# Patient Record
Sex: Female | Born: 1965 | Race: White | Hispanic: No | Marital: Married | Smoking: Never smoker
Health system: Southern US, Community
[De-identification: ages and names within clinical notes are randomized; demographics above are authoritative.]

## PROBLEM LIST (undated history)

## (undated) DIAGNOSIS — E119 Type 2 diabetes mellitus without complications: Secondary | ICD-10-CM

## (undated) DIAGNOSIS — C801 Malignant (primary) neoplasm, unspecified: Secondary | ICD-10-CM

## (undated) DIAGNOSIS — K769 Liver disease, unspecified: Secondary | ICD-10-CM

## (undated) DIAGNOSIS — E785 Hyperlipidemia, unspecified: Secondary | ICD-10-CM

## (undated) DIAGNOSIS — N814 Uterovaginal prolapse, unspecified: Secondary | ICD-10-CM

## (undated) DIAGNOSIS — M1990 Unspecified osteoarthritis, unspecified site: Secondary | ICD-10-CM

## (undated) DIAGNOSIS — C439 Malignant melanoma of skin, unspecified: Secondary | ICD-10-CM

## (undated) DIAGNOSIS — D126 Benign neoplasm of colon, unspecified: Secondary | ICD-10-CM

## (undated) DIAGNOSIS — N393 Stress incontinence (female) (male): Secondary | ICD-10-CM

## (undated) HISTORY — PX: LIVER BIOPSY: SHX301

## (undated) HISTORY — PX: OTHER SURGICAL HISTORY: SHX169

## (undated) HISTORY — PX: COLONOSCOPY W/ POLYPECTOMY: SHX1380

---

## 2005-05-23 ENCOUNTER — Ambulatory Visit: Payer: Self-pay | Admitting: Obstetrics and Gynecology

## 2006-03-23 ENCOUNTER — Ambulatory Visit: Payer: Self-pay

## 2006-03-29 ENCOUNTER — Ambulatory Visit: Payer: Self-pay | Admitting: Gastroenterology

## 2006-04-28 ENCOUNTER — Ambulatory Visit: Payer: Self-pay | Admitting: Gastroenterology

## 2006-06-06 ENCOUNTER — Ambulatory Visit: Payer: Self-pay | Admitting: Obstetrics and Gynecology

## 2006-08-11 ENCOUNTER — Ambulatory Visit: Payer: Self-pay | Admitting: Gastroenterology

## 2007-06-05 ENCOUNTER — Ambulatory Visit: Payer: Self-pay | Admitting: Internal Medicine

## 2007-06-12 ENCOUNTER — Ambulatory Visit: Payer: Self-pay | Admitting: Obstetrics and Gynecology

## 2008-06-03 ENCOUNTER — Ambulatory Visit: Payer: Self-pay | Admitting: Internal Medicine

## 2008-06-06 ENCOUNTER — Ambulatory Visit: Payer: Self-pay | Admitting: Internal Medicine

## 2008-06-18 ENCOUNTER — Ambulatory Visit: Payer: Self-pay | Admitting: Obstetrics and Gynecology

## 2009-06-22 ENCOUNTER — Ambulatory Visit: Payer: Self-pay | Admitting: Obstetrics and Gynecology

## 2010-06-23 ENCOUNTER — Ambulatory Visit: Payer: Self-pay | Admitting: Obstetrics and Gynecology

## 2011-07-26 ENCOUNTER — Ambulatory Visit: Payer: Self-pay | Admitting: Obstetrics and Gynecology

## 2012-07-26 ENCOUNTER — Ambulatory Visit: Payer: Self-pay | Admitting: Obstetrics and Gynecology

## 2013-01-02 ENCOUNTER — Ambulatory Visit: Payer: Self-pay | Admitting: Internal Medicine

## 2013-01-20 ENCOUNTER — Ambulatory Visit: Payer: Self-pay | Admitting: Internal Medicine

## 2013-02-19 ENCOUNTER — Ambulatory Visit: Payer: Self-pay | Admitting: Internal Medicine

## 2013-07-30 ENCOUNTER — Ambulatory Visit: Payer: Self-pay | Admitting: Obstetrics and Gynecology

## 2014-08-13 ENCOUNTER — Ambulatory Visit: Payer: Self-pay | Admitting: Obstetrics and Gynecology

## 2015-07-20 ENCOUNTER — Other Ambulatory Visit: Payer: Self-pay | Admitting: Obstetrics and Gynecology

## 2015-07-20 DIAGNOSIS — Z1231 Encounter for screening mammogram for malignant neoplasm of breast: Secondary | ICD-10-CM

## 2015-08-18 ENCOUNTER — Ambulatory Visit
Admission: RE | Admit: 2015-08-18 | Discharge: 2015-08-18 | Disposition: A | Discharge status: Home / Self Care | Payer: 59 | Source: Ambulatory Visit | Attending: Obstetrics and Gynecology | Admitting: Obstetrics and Gynecology

## 2015-08-18 DIAGNOSIS — Z1231 Encounter for screening mammogram for malignant neoplasm of breast: Secondary | ICD-10-CM | POA: Diagnosis present

## 2016-07-20 ENCOUNTER — Other Ambulatory Visit: Payer: Self-pay | Admitting: Obstetrics and Gynecology

## 2016-07-20 DIAGNOSIS — Z1231 Encounter for screening mammogram for malignant neoplasm of breast: Secondary | ICD-10-CM

## 2016-09-06 ENCOUNTER — Other Ambulatory Visit: Payer: Self-pay | Admitting: Nurse Practitioner

## 2016-09-06 ENCOUNTER — Other Ambulatory Visit (HOSPITAL_COMMUNITY): Payer: Self-pay | Admitting: Nurse Practitioner

## 2016-09-06 DIAGNOSIS — B009 Herpesviral infection, unspecified: Secondary | ICD-10-CM

## 2016-09-10 ENCOUNTER — Ambulatory Visit
Admission: RE | Admit: 2016-09-10 | Discharge: 2016-09-10 | Disposition: A | Discharge status: Home / Self Care | Payer: 59 | Source: Ambulatory Visit | Attending: Nurse Practitioner | Admitting: Nurse Practitioner

## 2016-09-10 DIAGNOSIS — K760 Fatty (change of) liver, not elsewhere classified: Secondary | ICD-10-CM | POA: Insufficient documentation

## 2016-09-10 DIAGNOSIS — R7989 Other specified abnormal findings of blood chemistry: Secondary | ICD-10-CM | POA: Insufficient documentation

## 2016-09-10 DIAGNOSIS — B009 Herpesviral infection, unspecified: Secondary | ICD-10-CM

## 2016-09-10 DIAGNOSIS — R160 Hepatomegaly, not elsewhere classified: Secondary | ICD-10-CM | POA: Diagnosis not present

## 2016-09-10 LAB — POCT I-STAT CREATININE: Creatinine, Ser: 0.6 mg/dL (ref 0.44–1.00)

## 2016-09-10 MED ORDER — GADOXETATE DISODIUM 0.25 MMOL/ML IV SOLN
10.0000 mL | Freq: Once | INTRAVENOUS | Status: AC | PRN
  Administered 2016-09-10: 7 mL via INTRAVENOUS

## 2017-07-25 ENCOUNTER — Other Ambulatory Visit: Payer: Self-pay | Admitting: Obstetrics and Gynecology

## 2017-07-25 DIAGNOSIS — Z1231 Encounter for screening mammogram for malignant neoplasm of breast: Secondary | ICD-10-CM

## 2017-08-24 ENCOUNTER — Ambulatory Visit
Admission: RE | Admit: 2017-08-24 | Discharge: 2017-08-24 | Disposition: A | Discharge status: Home / Self Care | Payer: Managed Care, Other (non HMO) | Source: Ambulatory Visit | Attending: Obstetrics and Gynecology | Admitting: Obstetrics and Gynecology

## 2017-08-24 DIAGNOSIS — Z1231 Encounter for screening mammogram for malignant neoplasm of breast: Secondary | ICD-10-CM | POA: Insufficient documentation

## 2017-08-24 HISTORY — DX: Malignant (primary) neoplasm, unspecified: C80.1

## 2017-08-25 ENCOUNTER — Inpatient Hospital Stay
Admission: RE | Admit: 2017-08-25 | Discharge: 2017-08-25 | Disposition: A | Discharge status: Home / Self Care | Payer: Self-pay | Source: Ambulatory Visit | Attending: *Deleted | Admitting: *Deleted

## 2017-08-25 ENCOUNTER — Other Ambulatory Visit: Payer: Self-pay | Admitting: *Deleted

## 2017-08-25 DIAGNOSIS — Z9289 Personal history of other medical treatment: Secondary | ICD-10-CM

## 2018-07-26 ENCOUNTER — Other Ambulatory Visit: Payer: Self-pay | Admitting: Obstetrics and Gynecology

## 2018-07-26 DIAGNOSIS — Z1231 Encounter for screening mammogram for malignant neoplasm of breast: Secondary | ICD-10-CM

## 2018-08-27 ENCOUNTER — Ambulatory Visit
Admission: RE | Admit: 2018-08-27 | Discharge: 2018-08-27 | Disposition: A | Discharge status: Home / Self Care | Payer: Managed Care, Other (non HMO) | Source: Ambulatory Visit | Attending: Obstetrics and Gynecology | Admitting: Obstetrics and Gynecology

## 2018-08-27 DIAGNOSIS — Z1231 Encounter for screening mammogram for malignant neoplasm of breast: Secondary | ICD-10-CM | POA: Insufficient documentation

## 2019-07-30 ENCOUNTER — Other Ambulatory Visit: Payer: Self-pay | Admitting: Obstetrics and Gynecology

## 2019-07-30 DIAGNOSIS — Z1231 Encounter for screening mammogram for malignant neoplasm of breast: Secondary | ICD-10-CM

## 2019-09-11 ENCOUNTER — Ambulatory Visit
Admission: RE | Admit: 2019-09-11 | Discharge: 2019-09-11 | Disposition: A | Discharge status: Home / Self Care | Payer: Managed Care, Other (non HMO) | Source: Ambulatory Visit | Attending: Obstetrics and Gynecology | Admitting: Obstetrics and Gynecology

## 2019-09-11 DIAGNOSIS — Z1231 Encounter for screening mammogram for malignant neoplasm of breast: Secondary | ICD-10-CM | POA: Diagnosis not present

## 2020-08-03 ENCOUNTER — Other Ambulatory Visit: Payer: Self-pay | Admitting: Obstetrics and Gynecology

## 2020-08-03 DIAGNOSIS — Z1231 Encounter for screening mammogram for malignant neoplasm of breast: Secondary | ICD-10-CM

## 2020-09-11 ENCOUNTER — Ambulatory Visit
Admission: RE | Admit: 2020-09-11 | Discharge: 2020-09-11 | Disposition: A | Discharge status: Home / Self Care | Payer: Managed Care, Other (non HMO) | Source: Ambulatory Visit | Attending: Obstetrics and Gynecology | Admitting: Obstetrics and Gynecology

## 2020-09-11 ENCOUNTER — Other Ambulatory Visit: Payer: Self-pay

## 2020-09-11 DIAGNOSIS — Z1231 Encounter for screening mammogram for malignant neoplasm of breast: Secondary | ICD-10-CM | POA: Diagnosis not present

## 2021-09-01 ENCOUNTER — Other Ambulatory Visit: Payer: Self-pay | Admitting: Certified Nurse Midwife

## 2021-09-01 DIAGNOSIS — Z1231 Encounter for screening mammogram for malignant neoplasm of breast: Secondary | ICD-10-CM

## 2021-11-05 ENCOUNTER — Ambulatory Visit
Admission: RE | Admit: 2021-11-05 | Discharge: 2021-11-05 | Disposition: A | Discharge status: Home / Self Care | Payer: Managed Care, Other (non HMO) | Source: Ambulatory Visit | Attending: Certified Nurse Midwife | Admitting: Certified Nurse Midwife

## 2021-11-05 ENCOUNTER — Other Ambulatory Visit: Payer: Self-pay

## 2021-11-05 DIAGNOSIS — Z1231 Encounter for screening mammogram for malignant neoplasm of breast: Secondary | ICD-10-CM | POA: Diagnosis present

## 2022-01-13 ENCOUNTER — Ambulatory Visit: Payer: Managed Care, Other (non HMO) | Attending: Obstetrics and Gynecology | Admitting: Physical Therapy

## 2022-01-13 ENCOUNTER — Encounter: Payer: Self-pay | Admitting: Physical Therapy

## 2022-01-13 DIAGNOSIS — R29898 Other symptoms and signs involving the musculoskeletal system: Secondary | ICD-10-CM | POA: Insufficient documentation

## 2022-01-13 DIAGNOSIS — M6281 Muscle weakness (generalized): Secondary | ICD-10-CM | POA: Insufficient documentation

## 2022-01-13 DIAGNOSIS — R278 Other lack of coordination: Secondary | ICD-10-CM | POA: Diagnosis present

## 2022-01-13 NOTE — Therapy (Signed)
OUTPATIENT PHYSICAL THERAPY FEMALE PELVIC EVALUATION   Patient Name: Haley Wolfe MRN: [id number] DOB:[birth year], 56 y.o., female Today's Date: 01/13/2022   PT End of Session - 01/13/22 1424     Visit Number 1    Number of Visits 6    Date for PT Re-Evaluation 02/24/22    Authorization Type IE 01/13/2022    PT Start Time 1430    PT Stop Time 1500    PT Time Calculation (min) 30 min    Activity Tolerance Patient tolerated treatment well;Other (comment)   Evaluation limited by late arrival to clinic.   Behavior During Therapy [facility] for tasks assessed/performed             Past Medical History:  Diagnosis Date   Cancer ([place])    melanoma   History reviewed. No pertinent surgical history. There are no problems to display for this patient.   PCP: [place] Pink, MD  REFERRING PROVIDER: Schermerhorn, Gwen Her, MD   REFERRING DIAG: N39.3 (ICD-10-CM) - Stress incontinence (female)   THERAPY DIAG:  Muscle weakness (generalized)  Other lack of coordination  Poor body mechanics  Rationale for Evaluation and Treatment Rehabilitation  PRECAUTIONS: None  WEIGHT BEARING RESTRICTIONS No  FALLS:  Has patient fallen in last 6 months? No  ONSET DATE: 2005  SUBJECTIVE:                                                                                                                                                                                           CHIEF COMPLAINT: Patient notes some mild improvement in urinary frequency with addition of myrbetriq. Patient notes that she has had less leakage when on a walk. Patient notes UI with physical activity has been ongoing for 15-20 years. Patient does have recollection of difficulty with pushing during childbirth and not being sure how to push. Patient does not have strong belief that she will benefit from PFPT and notes that she does not want participation/success with PFPT to limit her eligibility for surgical repair of cystocele.     PERTINENT HISTORY/CHART REVIEW:  Red flags (bowel/bladder changes, saddle paresthesia, personal history of cancer, h/o spinal tumors, h/o compression fx, h/o abdominal aneurysm, abdominal pain, chills/fever, night sweats, nausea, vomiting, unrelenting pain, first onset of insidious LBP <21 y/o): Negative  "G2P2 SVD .s/p Fourth degree laceration On HRT x 1 yr . Some pelvic pressure at time. Some urgency after emptying bladder. [...] SUI & Grade 1 anterior prolapse"  PAIN:  Are you having pain? No NPRS scale:  0/10 (current)  OCCUPATION/LEISURE ACTIVITIES:  Walking, golf, accountant  PLOF:  Independent  PATIENT GOALS: "Not  having to wear a pantyliner and not worrying about peeing when I cough or laugh."  OBSTETRICAL HISTORY: G2P2 Deliveries: SVD Tearing/Episiotomy: grade 4  GYNECOLOGICAL HISTORY: Hysterectomy: No Vaginal/Abdominal Endometriosis: Negative Last Menstrual Period:  Pain with exam: No  Prolapse: Cystocele grade 1 Heaviness/pressure: No   UROLOGICAL HISTORY: Frequency of urination: unable to articulate/speculate, notes frustration with frequency Incontinence: Coughing, Sneezing, Laughing, and Exercise  Onset: 2005 Amount: Min ~1%, Mod 98%, Complete Loss <1%.  Protective undergarments: Yes   Type: pantyliner; Number used/day: 5x Fluid Intake: ~16 oz H20, 1-2 cups coffee caffeinated, occasional diet coke Nocturia: 1x/night Toileting posture: feet flat Incomplete emptying: No sensation for it; however when she double voids she notes substantial volume with second void Pain with urination: Negative Stream: Strong Urgency: Yes  Difficulty initiating urination: Negative Intermittent stream: Negative Frequent UTI: Negative.   GASTROINTESTINAL HISTORY: Type of bowel movement: (Bristol Stool Scale) 3-4 Frequency of BMs: 1x/day Incomplete bowel movement: No  Pain with defecation: Negative Straining with defecation: Negative Toileting posture: feet  flat Incontinence: Negative.   SEXUAL HISTORY AND FUNCTION: Sexually active: Yes  Pain with penetration: deep thrusting and interrupts sexual activity "occasional" has not happened in past year but was an issue after birth of first child (~ 28 years ago) Pain with external stimulation: No  Change in ability to achieve orgasm: No   OBJECTIVE:  DIAGNOSTIC TESTING/IMAGING: "Neck: no thyromegaly Abdomen: soft , no mass, normal active bowel sounds, non-tender, no rebound tenderness Pelvic: tanner stage 5 ,  External genitalia: vulva /labia no lesions Urethra: no prolapse  Office cystometrics performed  Emptied bladder : no Leakage with cough  q tip 45 degree straight cath - residual 50 cc Filling with sterile H20 : first urge 130cc, max urge 330 cc Minimal leakage with lithotomy  + leakage with straddling step - standing"  COGNITION:  Patient is oriented to person, place, and time.  Recent memory is intact.  Remote memory is intact.  Attention span and concentration are intact.  Expressive speech is intact.  Patient's fund of knowledge is within normal limits for educational level.    POSTURE/OBSERVATIONS:   Lumbar lordosis: diminished Thoracic kyphosis: increased Patient sits with posteriorly tilted pelvis and shortened anterior chain which has high likelihood of increasing downward force/pressure on anterior portion of pelvis including bladder.  Iliac crest height: not formally assessed  Lumbar lateral shift: not formally assessed  Pelvic obliquity: not formally assessed  Leg length discrepancy: not formally assessed    GAIT:  Distance walked: 70 ft Assistive device utilized: None Level of assistance: Complete Independence Trendelenburg R: Negative L: Negative Grossly WFL.   RANGE OF MOTION: deferred 2/2 to time constraints  AROM (Normal range in degrees) AROM  01/13/2022  Lumbar   Flexion (65)   Extension (30)   Right lateral flexion (25)   Left lateral  flexion (25)   Right rotation (30)   Left rotation (30)       Hip LEFT RIGHT  Flexion (125)    Extension (15)    Abduction (40)    Adduction     Internal Rotation (45)    External Rotation (45)    (* = pain; blank rows = not tested)   SENSATION: deferred 2/2 to time constraints  Grossly intact to light touch bilateral LEs as determined by testing dermatomes L2-S2 Proprioception and hot/cold testing deferred on this date   STRENGTH: MMT deferred 2/2 to time constraints   RLE LLE  Hip Flexion  Hip Extension    Hip Abduction     Hip Adduction     Hip ER     Hip IR     Knee Extension    Knee Flexion    Dorsiflexion     Plantarflexion (seated)    (* = pain; blank rows = not tested)   MUSCLE LENGTH: deferred 2/2 to time constraints  Hamstrings:  Ely (quadriceps):  Thomas (hip flexors):  Ober:    ABDOMINAL: deferred 2/2 to time constraints  Palpation: Diastasis: Scar mobility: Rib flare:   SPECIAL TESTS: deferred 2/2 to time constraints   PALPATION: deferred 2/2 to time constraints  LOCATION LEFT  RIGHT           Lumbar paraspinals    Quadratus Lumborum    Gluteus Maximus    Gluteus Medius    Deep hip external rotators    PSIS    Fortin's Area (SIJ)    Greater Trochanter    ASIS    Sacral border    Coccyx    Ischial tuberosity    (blank rows = not tested) Graded on 0-4 scale (0 = no pain, 1 = pain, 2 = pain with wincing/grimacing/flinching, 3 = pain with withdrawal, 4 = unwilling to allow palpation)   PHYSICAL PERFORMANCE MEASURES:  STS: WNL Deep Squat: RLE STS: LLE STS:  6MWT: 5TSTS:     EXTERNAL PELVIC EXAM: deferred 2/2 to time constraints None given; testing deferred to later date  Breath coordination: Voluntary Contraction: present/absent Relaxation: full/delayed/non-relaxing Perineal movement with sustained IAP increase ("bear down"): descent/no change/elevation/excessive descent Perineal movement with rapid IAP increase  ("cough"): elevation/no change/descent Palpation of bulbocavernosus: Palpation of ischiocavernosus: Palpation of pubic symphysis: Palpation of superficial transverse perineal:   INTERNAL VAGINAL EXAM: deferred 2/2 to time constraints None given; testing deferred to later date  Introitus Appears:  Skin integrity:  Scar mobility: Strength (PERF):  Symmetry: Palpation: Prolapse: (0 no contraction, 1 flicker, 2 weak squeeze and no lift, 3 fair squeeze and definite lift, 4 good squeeze and lift against resistance, 5 strong squeeze against strong resistance)   RECTAL EXAM: not indicated None given; testing deferred to later date  Anal wink: present/absent Symmetry: Palpation: Strength (PERF):  (0 no contraction, 1 flicker, 2 weak squeeze and no lift, 3 fair squeeze and definite lift, 4 good squeeze and lift against resistance, 5 strong squeeze against strong resistance)   PATIENT EDUCATION:  Patient educated on prognosis, POC, and provided with HEP including: bladder diary. Patient articulated understanding and returned demonstration. Patient will benefit from further education in order to maximize compliance and understanding for long-term therapeutic gains.   PATIENT SURVEYS:  FOTO Urinary Problem 49; PFDI Urinary 38  ASSESSMENT:  Clinical Impression: Patient is a 56 year old presenting to clinic with chief complaints of urinary frequency, urgency, and incontinence. Today's evaluation is suggestive of deficits in IAP management, PFM coordination, PFM strength, and posture as evidenced by increased thoracic kyphosis and posteriorly tilted pelvis impeding symmetrical force distribution through the pelvis, UI with coughing/laughing/sneezing/exercise, incomplete bladder emptying. Patient's responses on FOTO outcome measures (Urinary Problem 49, PFDI Urinary 38) indicate significant functional limitations/disability/distress. Patient's progress may be limited due to decreased belief in  efficacy and limited time before surgical intervention; however, patient's attendance today is advantageous.Patient will benefit from continued skilled therapeutic intervention to address deficits in IAP management, PFM coordination, PFM strength, and posture in order to increase function and improve overall QOL.   Objective impairments: decreased activity tolerance, decreased  coordination, decreased endurance, decreased knowledge of condition, decreased strength, improper body mechanics, and postural dysfunction.   Activity limitations: cleaning, laundry, interpersonal relationship, community activity, occupation, and yard work.   Personal factors: Behavior pattern, Past/current experiences, Time since onset of injury/illness/exacerbation, 1 comorbidity: DM, and decreased belief in efficacy  are also affecting patient's functional outcome.   Rehab Potential: Fair Patient not reporting high confidence in efficacy of PFPT nad her ability/willingness to participate consistently with HEP. Patient does not want to choose between PFPT and surgical repair for complaint.  Clinical decision making: Evolving/moderate complexity  Evaluation complexity: Moderate   GOALS: Goals reviewed with patient? Yes  SHORT TERM GOALS: Target date: 02/10/2022  Patient will demonstrate independence with HEP in order to maximize therapeutic gains and improve carryover from physical therapy sessions to ADLs in the home and community. Baseline: not initiated Goal status: INITIAL   LONG TERM GOALS: Target date: 02/24/2022  Patient will demonstrate improved function as evidenced by a score of 60 on FOTO measure for full participation in activities at home and in the community.  Baseline: 49 Goal status: INITIAL  Patient will demonstrate circumferential and sequential contraction of >3/5 MMT, > 5 sec hold x5 and 5 consecutive quick flicks with </= 10 min rest between testing bouts, and relaxation of the PFM coordinated  with breath for improved management of intra-abdominal pressure and normal bowel and bladder function without the presence of pain nor incontinence in order to improve participation at home and in the community. Baseline: not formally assessed  Goal status: INITIAL  Patient will be able to articulate and demonstrate 3-5 postures that encourage gravity assisted repositioning of pelvic organs to decrease discomfort and need for manual insertion of tissue at end of day in order to participate more fully in activities at home and in the community.  Baseline: not formally assessed  Goal status: INITIAL  Patient will be able to walk for 30 minutes at [facility] pace with no UI and with no reliance on protective undergarment in order to effect meaningful change for the patient and improve function. Baseline: 5x pantyliners/day Goal status: INITIAL   PLAN: Rehab frequency: 1x/week  Rehab duration: 6 weeks  Planned interventions: Therapeutic exercises, Therapeutic activity, Neuromuscular re-education, Balance training, Gait training, Patient/Family education, Joint mobilization, Orthotic/Fit training, Electrical stimulation, Spinal mobilization, Cryotherapy, Moist heat, Taping, and Manual therapy    Myles Gip PT, DPT [phone] 01/13/2022, 3:25 PM

## 2022-01-20 ENCOUNTER — Ambulatory Visit: Payer: Managed Care, Other (non HMO) | Attending: Obstetrics and Gynecology | Admitting: Physical Therapy

## 2022-01-20 ENCOUNTER — Encounter: Payer: Self-pay | Admitting: Physical Therapy

## 2022-01-20 DIAGNOSIS — M6281 Muscle weakness (generalized): Secondary | ICD-10-CM | POA: Diagnosis present

## 2022-01-20 DIAGNOSIS — R29898 Other symptoms and signs involving the musculoskeletal system: Secondary | ICD-10-CM | POA: Diagnosis present

## 2022-01-20 DIAGNOSIS — R278 Other lack of coordination: Secondary | ICD-10-CM | POA: Diagnosis present

## 2022-01-20 NOTE — Therapy (Signed)
OUTPATIENT PHYSICAL THERAPY FEMALE PELVIC EVALUATION   Patient Name: Haley Wolfe MRN: [id number] DOB:1965-10-22, 56 y.o., female Today's Date: 01/20/2022   PT End of Session - 01/20/22 1123     Visit Number 2    Number of Visits 6    Date for PT Re-Evaluation 02/24/22    Authorization Type IE 01/13/2022    PT Start Time 1120    PT Stop Time 1200    PT Time Calculation (min) 40 min    Activity Tolerance Patient tolerated treatment well   Evaluation limited by late arrival to clinic.   Behavior During Therapy [facility] for tasks assessed/performed             Past Medical History:  Diagnosis Date   Cancer ([place])    melanoma   History reviewed. No pertinent surgical history. There are no problems to display for this patient.   PCP: [place] Pink, MD  REFERRING PROVIDER: Schermerhorn, Gwen Her, MD   REFERRING DIAG: N39.3 (ICD-10-CM) - Stress incontinence (female)   THERAPY DIAG:  Other lack of coordination  Poor body mechanics  Muscle weakness (generalized)  Rationale for Evaluation and Treatment Rehabilitation  PRECAUTIONS: None  WEIGHT BEARING RESTRICTIONS No  FALLS:  Has patient fallen in last 6 months? No  ONSET DATE: 2005  SUBJECTIVE:                                                                                                                                                                                           Patient notes that she kept bladder diary but fund it difficult to keep consistently. Bladder diary demonstrates emptying with small volume voided. Patient notes ability to store is usually somewhere between 1-1.5 hours.    PERTINENT HISTORY/CHART REVIEW:  Red flags (bowel/bladder changes, saddle paresthesia, personal history of cancer, h/o spinal tumors, h/o compression fx, h/o abdominal aneurysm, abdominal pain, chills/fever, night sweats, nausea, vomiting, unrelenting pain, first onset of insidious LBP <21 y/o): Negative  "G2P2 SVD .s/p  Fourth degree laceration On HRT x 1 yr . Some pelvic pressure at time. Some urgency after emptying bladder. [...] SUI & Grade 1 anterior prolapse"  PAIN:  Are you having pain? No NPRS scale:  0/10 (current)  OCCUPATION/LEISURE ACTIVITIES:  Walking, golf, accountant  PLOF:  Independent  PATIENT GOALS: "Not having to wear a pantyliner and not worrying about peeing when I cough or laugh."  TREATMENT  Pre-treatment assessment: DIAGNOSTIC TESTING/IMAGING: "Neck: no thyromegaly Abdomen: soft , no mass, normal active bowel sounds, non-tender, no rebound tenderness Pelvic: tanner stage 5 ,  External genitalia: vulva /labia no lesions Urethra: no prolapse  Office cystometrics performed  Emptied bladder : no Leakage with cough  q tip 45 degree straight cath - residual 50 cc Filling with sterile H20 : first urge 130cc, max urge 330 cc Minimal leakage with lithotomy  + leakage with straddling step - standing"   RANGE OF MOTION:   AROM (Normal range in degrees) AROM  01/20/2022  Lumbar   Flexion (65) WNL  Extension (30) WNL  Right lateral flexion (25) WNL  Left lateral flexion (25) WNL  Right rotation (30) WNL  Left rotation (30) WNL      Hip LEFT RIGHT  Flexion (125) WNL WNL  Extension (15)    Abduction (40) WNL WNL  Adduction     Internal Rotation (45) 15 degrees 15 degrees  External Rotation (45) WNL WNL  (* = pain; blank rows = not tested)   STRENGTH: MMT   RLE LLE  Hip Flexion 5 5  Hip Extension    Hip Abduction (seated) 5 5  Hip Adduction (seated) 5 5  Hip ER  4 4  Hip IR  5 4  Knee Extension 4 4  Knee Flexion 4 4  Dorsiflexion     Plantarflexion (seated)    (* = pain; blank rows = not tested)   MUSCLE LENGTH:   Hamstrings: ~ 60 degrees B Ely (quadriceps):  Thomas (hip flexors):  Ober:   ABDOMINAL:  Palpation: TTP over suprapubic area, with wince and withdraw from palpation Diastasis: ~1 finger or less breadth throughout Rib flare: none  noted  EXTERNAL PELVIC EXAM:  Patient educated on the purpose of the procedure/exam and articulated understanding and consented to the procedure/exam verbally.   Breath coordination: present, but not pronounced Voluntary Contraction: absent in isolation, significant abdominal, adductor and gluteal compensatory mechanisms. Unable to isolate with multiple trials, but did achieve 1/5 with increased cueing Relaxation: full Perineal movement with sustained IAP increase ("bear down"): no change Perineal movement with rapid IAP increase ("cough"): descent  01/20/2022  Manual Therapy:   Neuromuscular Re-education: Patient educated extensively on typical bladder function, typical bladder habits, basics of urge suppression, bladder irritants in order to better regulate bladder through behavioral changes.  Patient education on strategies and stretches to create length in PFM including: Supine knee to chest with PFM lengthening, BLE, for improved PFM spasm release Supine double knee to chest with PFM lengthening for improved PFM spasm release Supine butterfly with PFM lengthening, BLE, for improved PFM tissue length Supine figure 4 with PFM lengthening, BLE, for improved PFM tissue length  Therapeutic Exercise:   Treatments unbilled:  Post-treatment assessment:  Patient educated throughout session on appropriate technique and form using multi-modal cueing, HEP, and activity modification. Patient articulated understanding and returned demonstration.  Patient Response to interventions:    PATIENT SURVEYS:  FOTO Urinary Problem 49; PFDI Urinary 38  ASSESSMENT:  Clinical Impression: Patient presents to clinic with excellent motivation to participate in therapy. Patient demonstrates deficits in IAP management, PFM coordination, PFM strength, and posture. Patient able to achieve basic understanding of urinary urge and typical bladder function during today's session and responded positively to  educational interventions. External PFM assessment did confirm deficits with both coordination and strength as evidenced by perineal descent with sudden increase in IAP, lack of perineal movement with sustained IAP increase, and inability to isolate PFM for contraction with both squeeze and lift components. Also of note, patient id have increased tenderness to palpation of suprapubic area with wincing and withdraw from palpation.Patient will benefit from continued  skilled therapeutic intervention to address remaining deficits in IAP management, PFM coordination, PFM strength, and posture in order to increase function and improve overall QOL.    Objective impairments: decreased activity tolerance, decreased coordination, decreased endurance, decreased knowledge of condition, decreased strength, improper body mechanics, and postural dysfunction.   Activity limitations: cleaning, laundry, interpersonal relationship, community activity, occupation, and yard work.   Personal factors: Behavior pattern, Past/current experiences, Time since onset of injury/illness/exacerbation, 1 comorbidity: DM, and decreased belief in efficacy  are also affecting patient's functional outcome.   Rehab Potential: Fair Patient not reporting high confidence in efficacy of PFPT nad her ability/willingness to participate consistently with HEP. Patient does not want to choose between PFPT and surgical repair for complaint.  Clinical decision making: Evolving/moderate complexity  Evaluation complexity: Moderate   GOALS: Goals reviewed with patient? Yes  SHORT TERM GOALS: Target date: 02/10/2022  Patient will demonstrate independence with HEP in order to maximize therapeutic gains and improve carryover from physical therapy sessions to ADLs in the home and community. Baseline: not initiated Goal status: INITIAL   LONG TERM GOALS: Target date: 02/24/2022  Patient will demonstrate improved function as evidenced by a score of  60 on FOTO measure for full participation in activities at home and in the community.  Baseline: 49 Goal status: INITIAL  Patient will demonstrate circumferential and sequential contraction of >3/5 MMT, > 5 sec hold x5 and 5 consecutive quick flicks with </= 10 min rest between testing bouts, and relaxation of the PFM coordinated with breath for improved management of intra-abdominal pressure and normal bowel and bladder function without the presence of pain nor incontinence in order to improve participation at home and in the community. Baseline: not formally assessed  Goal status: INITIAL  Patient will be able to articulate and demonstrate 3-5 postures that encourage gravity assisted repositioning of pelvic organs to decrease discomfort and need for manual insertion of tissue at end of day in order to participate more fully in activities at home and in the community.  Baseline: not formally assessed  Goal status: INITIAL  Patient will be able to walk for 30 minutes at [facility] pace with no UI and with no reliance on protective undergarment in order to effect meaningful change for the patient and improve function. Baseline: 5x pantyliners/day Goal status: INITIAL   PLAN: Rehab frequency: 1x/week  Rehab duration: 6 weeks  Planned interventions: Therapeutic exercises, Therapeutic activity, Neuromuscular re-education, Balance training, Gait training, Patient/Family education, Joint mobilization, Orthotic/Fit training, Electrical stimulation, Spinal mobilization, Cryotherapy, Moist heat, Taping, and Manual therapy    Myles Gip PT, DPT [phone] 01/20/2022, 11:23 AM

## 2022-01-27 ENCOUNTER — Encounter: Payer: Managed Care, Other (non HMO) | Admitting: Physical Therapy

## 2022-02-03 ENCOUNTER — Ambulatory Visit: Payer: Managed Care, Other (non HMO) | Admitting: Physical Therapy

## 2022-02-08 ENCOUNTER — Encounter: Payer: Managed Care, Other (non HMO) | Admitting: Physical Therapy

## 2022-02-17 ENCOUNTER — Ambulatory Visit: Payer: Managed Care, Other (non HMO) | Admitting: Physical Therapy

## 2022-04-06 ENCOUNTER — Other Ambulatory Visit: Payer: Managed Care, Other (non HMO)

## 2022-04-27 NOTE — H&P (Signed)
Haley Wolfe is a 56 y.o. female presenting with Pre-op Exam (PMB)   History of Present Illness: Pt was having PMB and returned for SIS and EMBx 11/05/21. At this time, I was unable to fully evaluate her endometrial lining due to scarring with prior ablation. She also has POP and SUI. She returns today for preop for planned LAVH with BSO and bladder sling with Dr. Ouida Sills as co-surgeon. She has seen PFPT twice since her last visit with me.    EMBx 08/2021 RARE BENIGN ENDOMETRIAL FRAGMENTS ARE SEEN IN A SPECIMEN THAT CONSISTS MAINLY OF MUCUS, BLOOD, AND ENDOCERVICAL GLANDS. NO ATYPIA OR MALIGNANCY.   SALINE [place] 10/2021 - Uterus: 10.78 x 8.35 x 8.57 cm - Ovaries: neither seen  - Endometrium: 7.78 mm  - Other: 3 fibroids 1 rt ant=2.69 cm, 2 lt ant=2.05 cm, 3 rt mid= 2.64 cm  Last pap 07/2019 neg/neg    Pertinent Hx:  - SVD x 2, with 4th degree vaginal tearing  - Hx of endometrial ablation 2010 with return of AUB.    -Hot flashes, no sleeping, and irritability concerns in 07/2018             - HRT: Vivelle dot 0.075 mg and Prometrium 100 mg -Grade I anterior pelvic organ prolapse -SUI, very bothersome & limits her exercise             -Discussed several options for POP & SUI,  including PFPT and surgical, discussed referral to urogyn for urodynamics -Pt was interested in pessary and was considering returning for one   - Hx of melanoma; excised in 2000   -Hx of fatty liver; bx benign             -No active liver disease but does have a hx of elevated LFTs and liver lesions -S/p OCPs for menstrual control, but stopped 2019 due to of elevated liver enzymes & hyperlipidemia.               -We decreased the estrogen dose 2018, but eventually went off a few months prior to 2019  annual because labs did not improve.   -Vasectomy -Left labial bump 2019, HSV collected but negative  Past Medical History:  has a past medical history of Colon cancer screening (09/09/2016), Diabetes mellitus  without complication (CMS-HCC), Elevated LFTs (09/06/2016), Hepatic lesion (09/06/2016), Hyperlipidemia, and Hypertension.  Past Surgical History:  has a past surgical history that includes Episiotomy/Vaginal Repair; Excision melanoma (2000); Endometrial ablation (01/23/2009); Colonoscopy (06/19/2017); and egd (02/02/2018). Family History: family history includes Alzheimer's disease in her maternal grandmother and paternal grandmother; Breast cancer in her maternal grandmother; Colon cancer in her maternal grandfather; High blood pressure (Hypertension) in her father; Hyperlipidemia (Elevated cholesterol) in her father; Myocardial Infarction (Heart attack) in her father. Social History:  reports that she has never smoked. She has never used smokeless tobacco. She reports current alcohol use of about 1.0 standard drink per week. She reports that she does not use drugs. OB/GYN History:  OB History    Gravida 2  Para 2  Term 2  Preterm    AB    Living 2    SAB    IAB    Ectopic    Molar    Multiple    Live Births 2      Allergies: is allergic to penicillin v potassium. Medications: Current Outpatient Medications:    ALPRAZolam (XANAX) 0.25 MG tablet, TAKE 1 TABLET BY MOUTH AT  BEDTIME AS NEEDED,  Disp: 90 tablet, Rfl: 1   BIOTIN ORAL, Take by mouth, Disp: , Rfl:    blood glucose diagnostic (ONETOUCH VERIO) test strip, Use 2 (two) times daily. DX CODE E11.9, Disp: 180 each, Rfl: 3   dulaglutide (TRULICITY) 3 GE/9.5 mL subcutaneous pen injector, Inject 0.5 mLs (3 mg total) subcutaneously every 7 (seven) days, Disp: 6 mL, Rfl: 1   estradiol (VIVELLE-DOT) patch 0.075 mg/24hr, Place 1 patch onto the skin twice a week, Disp: 24 patch, Rfl: 3   fluticasone propionate (FLONASE) 50 mcg/actuation nasal spray, Place 1 spray into both nostrils 2 (two) times daily, Disp: 48 g, Rfl: 0   meloxicam (MOBIC) 15 MG tablet, TAKE 1 TABLET(15 MG) BY MOUTH EVERY DAY, Disp: 90 tablet,  Rfl: 3   metFORMIN (GLUCOPHAGE-XR) 500 MG XR tablet, Take 2 tablets (1,000 mg total) by mouth daily with dinner, Disp: 180 tablet, Rfl: 3   mirabegron (MYRBETRIQ) 25 mg ER Tablet, Take 1 tablet (25 mg total) by mouth once daily, Disp: 30 tablet, Rfl: 11   olopatadine (PATADAY) 0.2 % ophthalmic solution, Place 1 drop into both eyes once daily, Disp: 2.5 mL, Rfl: 0   omeprazole (PRILOSEC) 40 MG DR capsule, Take 1 capsule (40 mg total) by mouth once daily, Disp: 90 capsule, Rfl: 3   progesterone (PROMETRIUM) 100 MG capsule, TAKE 1 CAPSULE BY MOUTH  ONCE DAILY, Disp: 90 capsule, Rfl: 3   rosuvastatin (CRESTOR) 20 MG tablet, Take 1 tablet (20 mg total) by mouth once daily, Disp: 90 tablet, Rfl: 3   valACYclovir (VALTREX) 500 MG tablet, Take 1 tablet (500 mg total) by mouth once daily, Disp: 90 tablet, Rfl: 3   zolpidem (AMBIEN) 5 MG tablet, TAKE 1 TABLET BY MOUTH AT  BEDTIME AS NEEDED FOR SLEEP, Disp: 90 tablet, Rfl: 1  Review of Systems: No SOB, no palpitations or chest pain, no new lower extremity edema, no nausea or vomiting or bowel or bladder complaints. See HPI for gyn specific ROS.   Exam:  LMP 05/30/2017 (Approximate)   General: Patient is well-groomed, well-nourished, appears stated age in no acute distress   HEENT: head is atraumatic and normocephalic, trachea is midline, neck is supple with no palpable nodules   CV: Regular rhythm and normal heart rate, no murmur   Pulm: Clear to auscultation throughout lung fields with no wheezing, crackles, or rhonchi. No increased work of breathing  Abdomen: soft , no mass, non-tender, no rebound tenderness, no hepatomegaly  Pelvic: tanner stage 5 ,   External genitalia: vulva /labia no lesions  Urethra: no prolapse  Vagina: normal physiologic d/c, laxity in vaginal walls  Cervix: no lesions, no cervical motion tenderness, good descent  Uterus: normal size shape and contour, non-tender  Adnexa: no mass,  non-tender    Rectovaginal: External  wnl  Impression:  The primary encounter diagnosis was Post-menopausal bleeding. A diagnosis of Stress incontinence in female was also pertinent to this visit.  Plan:  - PMB, POP, Preop Exam  -Patient returns for a preoperative discussion regarding her plans to proceed with surgical treatment of her PMB and pelvic organ prolapse by total laparoscopic assisted vaginal hysterectomy with bilateral salpingectomy and oophorectomy. Dr. Ouida Sills will perform a mid-urethral sling for her stress urinary incontinence. We may perform a cystoscopy to evaluate the urinary tract after the procedure, if surgically indicated for uro tract integrity.   The patient and I discussed the technical aspects of the procedure including the potential for risks and complications.  These include but  are not limited to the risk of infection requiring post-operative antibiotics or further procedures.  We talked about the risk of injury to adjacent organs including bladder, bowel, ureter, blood vessels or nerves.  We talked about the need to convert to an open incision.  We talked about the possible need for blood transfusion.  We talked about postop complications such as thromboembolic or cardiopulmonary complications.  All of her questions were answered.  Her preoperative exam was completed and the appropriate consents were signed. She is scheduled to undergo this procedure in the near future.  Specific Peri-operative Considerations:  - Consent: obtained today - Health Maintenance: up to date - Labs: CBC, CMP preoperatively - Studies: EKG, CXR preoperatively per anesthesia - Bowel Preparation: None required - Abx:  Ancef 2 g - VTE ppx: SCDs perioperatively - Glucose Protocol: n/a - Beta-blockade: if required

## 2022-05-03 ENCOUNTER — Other Ambulatory Visit: Payer: Self-pay

## 2022-05-03 ENCOUNTER — Encounter
Admission: RE | Admit: 2022-05-03 | Discharge: 2022-05-03 | Disposition: A | Discharge status: Home / Self Care | Payer: Managed Care, Other (non HMO) | Source: Ambulatory Visit | Attending: Obstetrics and Gynecology | Admitting: Obstetrics and Gynecology

## 2022-05-03 DIAGNOSIS — Z01812 Encounter for preprocedural laboratory examination: Secondary | ICD-10-CM

## 2022-05-03 HISTORY — DX: Hyperlipidemia, unspecified: E78.5

## 2022-05-03 HISTORY — DX: Liver disease, unspecified: K76.9

## 2022-05-03 HISTORY — DX: Malignant melanoma of skin, unspecified: C43.9

## 2022-05-03 HISTORY — DX: Uterovaginal prolapse, unspecified: N81.4

## 2022-05-03 HISTORY — DX: Stress incontinence (female) (male): N39.3

## 2022-05-03 HISTORY — DX: Benign neoplasm of colon, unspecified: D12.6

## 2022-05-03 HISTORY — DX: Unspecified osteoarthritis, unspecified site: M19.90

## 2022-05-03 HISTORY — DX: Type 2 diabetes mellitus without complications: E11.9

## 2022-05-03 NOTE — Patient Instructions (Addendum)
Your procedure is scheduled on: 05/12/22 - Thursday Report to the Registration Desk on the 1st floor of the [facility]. To find out your arrival time, please call [phone] between 1PM - 3PM on: 05/11/22 - Wednesday If your arrival time is 6:00 am, do not arrive prior to that time as the [facility] entrance doors do not open until 6:00 am.  REMEMBER: Instructions that are not followed completely may result in serious medical risk, up to and including death; or upon the discretion of your surgeon and anesthesiologist your surgery may need to be rescheduled.  Do not eat food after midnight the night before surgery.  No gum chewing, lozengers or hard candies.  You may however, drink CLEAR liquids up to 2 hours before you are scheduled to arrive for your surgery. Do not drink anything within 2 hours of your scheduled arrival time.Type 1 and Type 2 diabetics should only drink water.  TAKE THESE MEDICATIONS THE MORNING OF SURGERY WITH A SIP OF WATER:  - omeprazole (PRILOSEC) - (take one the night before and one on the morning of surgery - helps to prevent nausea after surgery.)  HOLD Dulaglutide 7 days prior to your surgery.  HOLD metFORMIN (GLUCOPHAGE-XR) beginning 05/10/22, may resume the day after your surgery.  One week prior to surgery: meloxicam [facility])  Stop Anti-inflammatories (NSAIDS) such as Advil, Aleve, Ibuprofen, Motrin, Naproxen, Naprosyn and Aspirin based products such as Excedrin, Goodys Powder, BC Powder.  Stop ANY OVER THE COUNTER supplements until after surgery.  You may take Tylenol if needed for pain up until the day of surgery.  No Alcohol for 24 hours before or after surgery.  No Smoking including e-cigarettes for 24 hours prior to surgery.  No chewable tobacco products for at least 6 hours prior to surgery.  No nicotine patches on the day of surgery.  Do not use any "recreational" drugs for at least a week prior to your surgery.  Please be advised that  the combination of cocaine and anesthesia may have negative outcomes, up to and including death. If you test positive for cocaine, your surgery will be cancelled.  On the morning of surgery brush your teeth with toothpaste and water, you may rinse your mouth with mouthwash if you wish. Do not swallow any toothpaste or mouthwash.  Use CHG Soap or wipes as directed on instruction sheet.  Do not wear jewelry, make-up, hairpins, clips or nail polish.  Do not wear lotions, powders, or perfumes.   Do not shave body from the neck down 48 hours prior to surgery just in case you cut yourself which could leave a site for infection.  Also, freshly shaved skin may become irritated if using the CHG soap.  Contact lenses, hearing aids and dentures may not be worn into surgery.  Do not bring valuables to the hospital. [facility] is not responsible for any missing/lost belongings or valuables.   Notify your doctor if there is any change in your medical condition (cold, fever, infection).  Wear comfortable clothing (specific to your surgery type) to the hospital.  After surgery, you can help prevent lung complications by doing breathing exercises.  Take deep breaths and cough every 1-2 hours. Your doctor may order a device called an Incentive Spirometer to help you take deep breaths. When coughing or sneezing, hold a pillow firmly against your incision with both hands. This is called "splinting." Doing this helps protect your incision. It also decreases belly discomfort.  If you are being  admitted to the hospital overnight, leave your suitcase in the car. After surgery it may be brought to your room.  If you are being discharged the day of surgery, you will not be allowed to drive home. You will need a responsible adult (18 years or older) to drive you home and stay with you that night.   If you are taking public transportation, you will need to have a responsible adult (18 years or older) with  you. Please confirm with your physician that it is acceptable to use public transportation.   Please call the [facility] Dept. at [phone] if you have any questions about these instructions.  Surgery Visitation Policy:  Patients undergoing a surgery or procedure may have two family members or support persons with them as long as the person is not COVID-19 positive or experiencing its symptoms.   Inpatient Visitation:    Visiting hours are 7 a.m. to 8 p.m. Up to four visitors are allowed at one time in a patient room, including children. The visitors may rotate out with other people during the day. One designated support person (adult) may remain overnight.

## 2022-05-10 ENCOUNTER — Encounter
Admission: RE | Admit: 2022-05-10 | Discharge: 2022-05-10 | Disposition: A | Discharge status: Home / Self Care | Payer: Managed Care, Other (non HMO) | Source: Ambulatory Visit | Attending: Obstetrics and Gynecology | Admitting: Obstetrics and Gynecology

## 2022-05-10 DIAGNOSIS — Z01812 Encounter for preprocedural laboratory examination: Secondary | ICD-10-CM | POA: Insufficient documentation

## 2022-05-10 DIAGNOSIS — N950 Postmenopausal bleeding: Secondary | ICD-10-CM | POA: Diagnosis not present

## 2022-05-10 LAB — BASIC METABOLIC PANEL
Anion gap: 10 (ref 5–15)
BUN: 10 mg/dL (ref 6–20)
CO2: 24 mmol/L (ref 22–32)
Calcium: 9.3 mg/dL (ref 8.9–10.3)
Chloride: 106 mmol/L (ref 98–111)
Creatinine, Ser: 0.57 mg/dL (ref 0.44–1.00)
GFR, Estimated: >60 mL/min (ref >60)
Glucose, Bld: 100 mg/dL — ABNORMAL HIGH (ref 70–99)
Potassium: 4.5 mmol/L (ref 3.5–5.1)
Sodium: 140 mmol/L (ref 135–145)

## 2022-05-10 LAB — TYPE AND SCREEN
ABO/RH(D): A POS
Antibody Screen: NEGATIVE

## 2022-05-10 LAB — CBC
HCT: 44.7 % (ref 36.0–46.0)
Hemoglobin: 15.2 g/dL — ABNORMAL HIGH (ref 12.0–15.0)
MCH: 30.6 pg (ref 26.0–34.0)
MCHC: 34 g/dL (ref 30.0–36.0)
MCV: 89.9 fL (ref 80.0–100.0)
Platelets: 159 10*3/uL (ref 150–400)
RBC: 4.97 MIL/uL (ref 3.87–5.11)
RDW: 12.4 % (ref 11.5–15.5)
WBC: 4 10*3/uL (ref 4.0–10.5)
nRBC: 0 % (ref 0.0–0.2)

## 2022-05-10 NOTE — Progress Notes (Signed)
  Perioperative Services Pre-Admission/Anesthesia Testing    Date: 05/10/22  Name: Haley Wolfe MRN:   [id number]  Re: GLP-1 clearance and provider recommendations   Planned Surgical Procedure(s):    Case: [id number] Date/Time: 05/12/22 0715   Procedures:      LAPAROSCOPIC ASSISTED VAGINAL HYSTERECTOMY WITH SALPINGO OOPHORECTOMY (Bilateral)     ANTERIOR REPAIR (CYSTOCELE)     TRANSVAGINAL TAPE (TVT) PROCEDURE   Anesthesia type: Choice   Pre-op diagnosis: postmenopausal bleeding, stress urinary incontinence, cystocele   Location: [facility] OR ROOM 05 / [facility]   Surgeons: Benjaman Kindler, MD   Clinical Notes:  Patient is scheduled for the above procedure on 05/12/2022 with Dr. Benjaman Kindler, MD. In review of her medication reconciliation it was noted that patient is on a prescribed GLP-1 medication. Per guidelines issued by the American Society of Anesthesiologists (ASA), it is recommended that these medications be held for 7 days prior to the patient undergoing any type of elective surgical procedure. The patient is taking the following GLP-1 medication:  '[]'$  SEMAGLUTIDE (Ozempic, Rybelsus)  '[]'$  EXENATIDE (Bydureon, Byetta) '[]'$  LIRAGLUTIDE (Victoza, Saxenda)  '[]'$  LIXISENATIDE (Adalyxin) '[x]'$  DULAGLUTIDE (Trulicity)    '[]'$  OTHER GLP-1 medication: _______________  Reached out to prescribing provider Gorden Harms, MD) to make them aware of the guidelines from anesthesia. Given that this patient takes the prescribed GLP-1 medication for her  diabetes diagnosis, rather than for weight loss, recommendations from the prescribing provider were solicited. Prescribing provider made aware of the following so that informed decision/POC can be developed for this patient that may be taking medications belonging to these drug classes:  Oral GLP-1 medications will be held 1 day prior to surgery.  Injectable GLP-1 medications will be held 7 days prior to surgery.  Metformin is routinely  held 48 hours prior to surgery due to renal concerns, potential need for contrasted imaging perioperatively, and the potential for tissue hypoxia leading to drug induced lactic acidosis.  All SGLT2i medications are held 72 hours prior to surgery as they can be associated with the increased potential for developing euglycemic diabetic ketoacidosis (EDKA).   Impression and Plan:  [name] is on a prescribed GLP-1 medication, which induces the known side effect of decreased gastric emptying. Efforts are bring made to mitigate the risk of perioperative hyperglycemic events, as elevated blood glucose levels have been found to contribute to intra/postoperative complications. Additionally, hyperglycemic extremes can potentially necessitate the postponing of a patient's elective case in order to better optimize perioperative glycemic control, again with the aforementioned guidelines in place. With this in mind, recommendations have been sought from the prescribing provider, who has cleared patient to proceed with holding the prescribed GLP-1 as per the guidelines from the ASA.   Provider recommending: no further recommendations received from the prescribing provider.  Copy of signed clearance and recommendations placed on patient's chart for inclusion in their medical record and for review by the surgical/anesthetic team on the day of her procedure.   Honor Loh, MSN, APRN, FNP-C, CEN [facility]  Peri-operative Services Nurse Practitioner Phone: [phone] 05/10/22 1:12 PM  NOTE: This note has been prepared using Dragon dictation software. Despite my best ability to proofread, there is always the potential that unintentional transcriptional errors may still occur from this process.

## 2022-05-11 NOTE — Progress Notes (Signed)
Haley Wolfe is a 56 y.o. female here for Discuss surgery .pt referred from Dr Leafy Ro for urinary incontinence . She  Has c/o of leakage of urine for several yrs . Use to run and train and leakage cause her to do some of these activities . G2P2  SVD .s/p Fourth degree laceration   On HRT x 1 yr . Some pelvic pressure at time. Some urgency after emptying bladder.  She is not doing Kegel exercises.  Dr Leafy Ro is working her up for PMB . She is s/p Ablation and u/s show endometrial stripe 7 mm     Past Medical History:  has a past medical history of Colon cancer screening (09/09/2016), Diabetes mellitus without complication (CMS-HCC), Elevated LFTs (09/06/2016), Hepatic lesion (09/06/2016), Hyperlipidemia, and Hypertension.  Past Surgical History:  has a past surgical history that includes Episiotomy/Vaginal Repair; Excision melanoma (2000); Endometrial ablation (01/23/2009); Colonoscopy (06/19/2017); and egd (02/02/2018). Family History: family history includes Alzheimer's disease in her maternal grandmother and paternal grandmother; Breast cancer in her maternal grandmother; Colon cancer in her maternal grandfather; High blood pressure (Hypertension) in her father; Hyperlipidemia (Elevated cholesterol) in her father; Myocardial Infarction (Heart attack) in her father. Social History:  reports that she has never smoked. She has never used smokeless tobacco. She reports current alcohol use of about 1.0 standard drink per week. She reports that she does not use drugs. OB/GYN History:          OB History     Gravida  2   Para  2   Term  2   Preterm      AB      Living  2      SAB      IAB      Ectopic      Molar      Multiple      Live Births  2             Allergies: is allergic to penicillin v potassium. Medications:   Current Outpatient Medications:    ALPRAZolam (XANAX) 0.25 MG tablet, Take 1 tablet (0.25 mg total) by mouth at bedtime as needed, Disp: 90 tablet, Rfl: 0    BIOTIN ORAL, Take by mouth, Disp: , Rfl:    blood glucose diagnostic (ONETOUCH VERIO) test strip, Use 2 (two) times daily. DX CODE E11.9, Disp: 180 each, Rfl: 3   estradiol (VIVELLE-DOT) patch 0.075 mg/24hr, Place 1 patch onto the skin twice a week, Disp: 24 patch, Rfl: 3   fluticasone propionate (FLONASE) 50 mcg/actuation nasal spray, Place 1 spray into both nostrils 2 (two) times daily, Disp: 48 g, Rfl: 0   meloxicam (MOBIC) 15 MG tablet, TAKE 1 TABLET(15 MG) BY MOUTH EVERY DAY, Disp: 90 tablet, Rfl: 3   metFORMIN (GLUCOPHAGE-XR) 500 MG XR tablet, Take 2 tablets (1,000 mg total) by mouth daily with dinner, Disp: 180 tablet, Rfl: 3   omeprazole (PRILOSEC) 40 MG DR capsule, Take 1 capsule (40 mg total) by mouth once daily, Disp: 90 capsule, Rfl: 3   progesterone (PROMETRIUM) 100 MG capsule, TAKE 1 CAPSULE BY MOUTH  ONCE DAILY, Disp: 90 capsule, Rfl: 3   rosuvastatin (CRESTOR) 20 MG tablet, Take 1 tablet (20 mg total) by mouth once daily, Disp: 90 tablet, Rfl: 3   valACYclovir (VALTREX) 500 MG tablet, Take 1 tablet (500 mg total) by mouth once daily, Disp: 90 tablet, Rfl: 3   zolpidem (AMBIEN) 5 MG tablet, Take 1 tablet (5 mg total) by mouth  at bedtime as needed for Sleep, Disp: 90 tablet, Rfl: 1   dulaglutide (TRULICITY) 1.5 HW/2.9 mL subcutaneous pen injector, Inject 0.5 mLs (1.5 mg total) subcutaneously every 7 (seven) days for 30 days, Disp: 6 mL, Rfl: 3   mirabegron (MYRBETRIQ) 25 mg ER Tablet, Take 1 tablet (25 mg total) by mouth once daily, Disp: 30 tablet, Rfl: 11   nitrofurantoin, macrocrystal-monohydrate, (MACROBID) 100 MG capsule, Take 1 capsule (100 mg total) by mouth 2 (two) times daily for 2 days, Disp: 4 capsule, Rfl: 0   olopatadine (PATADAY) 0.2 % ophthalmic solution, Place 1 drop into both eyes once daily (Patient not taking: Reported on 12/24/2021), Disp: 2.5 mL, Rfl: 0   Review of Systems: General:                      No fatigue or weight loss Eyes:                           No  vision changes Ears:                            No hearing difficulty Respiratory:                No cough or shortness of breath Pulmonary:                  No asthma or shortness of breath Cardiovascular:           No chest pain, palpitations, dyspnea on exertion Gastrointestinal:          No abdominal bloating, chronic diarrhea, constipations, masses, pain or hematochezia Genitourinary:             No hematuria, dysuria, abnormal vaginal discharge, pelvic pain, Menometrorrhagia Lymphatic:                   No swollen lymph nodes Musculoskeletal:         No muscle weakness Neurologic:                  No extremity weakness, syncope, seizure disorder Psychiatric:                  No history of depression, delusions or suicidal/homicidal ideation      Exam:       Vitals:    12/24/21 1451  BP: (!) 156/81  Pulse: 78      Body mass index is 25.66 kg/m.   WDWN white/ female in NAD   Lungs: CTA  CV : RRR without murmur     Neck:  no thyromegaly Abdomen: soft , no mass, normal active bowel sounds,  non-tender, no rebound tenderness Pelvic: tanner stage 5 ,  External genitalia: vulva /labia no lesions Urethra: no prolapse   Office cystometrics performed  Emptied bladder : no Leakage with cough  q tip 45 degree straight  cath - residual 50 cc Filling with sterile H20 : first urge 130cc, max urge 330 cc Minimal leakage with lithotomy  + leakage with straddling step - standing     Impression:    The primary encounter diagnosis was Urinary, incontinence, stress female. Diagnoses of Dysuria, Stress incontinence in female, and Post-menopausal bleeding were also pertinent to this visit.       Plan:  Pt was counseled in office on 04/27/22 regarding the surgical repair of SUI with the exact TVT  procedure. I thoroughly reviewed the procedure , the risks involved most notably injury to the urether , bladder , urinary retention requiring self catheterization ( short and long term ),  failure to correct her SUI, bleeding requiring additional surgery and blood transfusion .  She expresses understanding of these issues and wishes to proceed with the combined procedure ( Dr Leafy Ro LAVH/BSO) and my TVT procedure.  Consents signed  and all questions answered

## 2022-05-12 ENCOUNTER — Ambulatory Visit: Payer: Managed Care, Other (non HMO) | Admitting: Urgent Care

## 2022-05-12 ENCOUNTER — Other Ambulatory Visit: Payer: Self-pay

## 2022-05-12 ENCOUNTER — Observation Stay
Admission: RE | Admit: 2022-05-12 | Discharge: 2022-05-13 | Disposition: A | Discharge status: Home / Self Care | Payer: Managed Care, Other (non HMO) | Attending: Obstetrics and Gynecology | Admitting: Obstetrics and Gynecology

## 2022-05-12 ENCOUNTER — Encounter
Admission: RE | Disposition: A | Discharge status: Home / Self Care | Payer: Self-pay | Source: Home / Self Care | Attending: Obstetrics and Gynecology

## 2022-05-12 ENCOUNTER — Encounter: Payer: Self-pay | Admitting: Obstetrics and Gynecology

## 2022-05-12 DIAGNOSIS — E119 Type 2 diabetes mellitus without complications: Secondary | ICD-10-CM | POA: Insufficient documentation

## 2022-05-12 DIAGNOSIS — Z7984 Long term (current) use of oral hypoglycemic drugs: Secondary | ICD-10-CM | POA: Diagnosis not present

## 2022-05-12 DIAGNOSIS — N950 Postmenopausal bleeding: Secondary | ICD-10-CM | POA: Diagnosis not present

## 2022-05-12 DIAGNOSIS — Z7985 Long-term (current) use of injectable non-insulin antidiabetic drugs: Secondary | ICD-10-CM | POA: Diagnosis not present

## 2022-05-12 DIAGNOSIS — Z79899 Other long term (current) drug therapy: Secondary | ICD-10-CM | POA: Insufficient documentation

## 2022-05-12 DIAGNOSIS — Z01812 Encounter for preprocedural laboratory examination: Secondary | ICD-10-CM

## 2022-05-12 DIAGNOSIS — N393 Stress incontinence (female) (male): Principal | ICD-10-CM | POA: Insufficient documentation

## 2022-05-12 DIAGNOSIS — I10 Essential (primary) hypertension: Secondary | ICD-10-CM | POA: Insufficient documentation

## 2022-05-12 DIAGNOSIS — N8110 Cystocele, unspecified: Secondary | ICD-10-CM | POA: Diagnosis not present

## 2022-05-12 DIAGNOSIS — Z9889 Other specified postprocedural states: Secondary | ICD-10-CM

## 2022-05-12 HISTORY — PX: BLADDER SUSPENSION: SHX72

## 2022-05-12 HISTORY — PX: CYSTOSCOPY: SHX5120

## 2022-05-12 HISTORY — PX: LAPAROSCOPIC VAGINAL HYSTERECTOMY WITH SALPINGO OOPHORECTOMY: SHX6681

## 2022-05-12 LAB — BASIC METABOLIC PANEL
Anion gap: 8 (ref 5–15)
BUN: 14 mg/dL (ref 6–20)
CO2: 23 mmol/L (ref 22–32)
Calcium: 8.4 mg/dL — ABNORMAL LOW (ref 8.9–10.3)
Chloride: 106 mmol/L (ref 98–111)
Creatinine, Ser: 0.59 mg/dL (ref 0.44–1.00)
GFR, Estimated: >60 mL/min (ref >60)
Glucose, Bld: 201 mg/dL — ABNORMAL HIGH (ref 70–99)
Potassium: 4 mmol/L (ref 3.5–5.1)
Sodium: 137 mmol/L (ref 135–145)

## 2022-05-12 LAB — GLUCOSE, CAPILLARY
Glucose-Capillary: 91 mg/dL (ref 70–99)
Glucose-Capillary: 151 mg/dL — ABNORMAL HIGH (ref 70–99)
Glucose-Capillary: 181 mg/dL — ABNORMAL HIGH (ref 70–99)

## 2022-05-12 LAB — POCT PREGNANCY, URINE: Preg Test, Ur: NEGATIVE

## 2022-05-12 LAB — HEMOGLOBIN AND HEMATOCRIT, BLOOD
HCT: 32.8 % — ABNORMAL LOW (ref 36.0–46.0)
Hemoglobin: 11.2 g/dL — ABNORMAL LOW (ref 12.0–15.0)

## 2022-05-12 LAB — ABO/RH: ABO/RH(D): A POS

## 2022-05-12 SURGERY — HYSTERECTOMY, VAGINAL, LAPAROSCOPY-ASSISTED, WITH SALPINGO-OOPHORECTOMY
Anesthesia: General

## 2022-05-12 MED ORDER — ORAL CARE MOUTH RINSE: 15.0000 mL | Freq: Once | OROMUCOSAL | Status: AC

## 2022-05-12 MED ORDER — LIDOCAINE-EPINEPHRINE 1 %-1:100000 IJ SOLN
INTRAMUSCULAR | Status: AC
  Filled 2022-05-12: qty 1

## 2022-05-12 MED ORDER — PROPOFOL 1000 MG/100ML IV EMUL
INTRAVENOUS | Status: AC
  Filled 2022-05-12: qty 100

## 2022-05-12 MED ORDER — ACETAMINOPHEN 500 MG PO TABS
ORAL_TABLET | ORAL | Status: AC
  Administered 2022-05-12: 1000 mg via ORAL
  Filled 2022-05-12: qty 2

## 2022-05-12 MED ORDER — ACETAMINOPHEN 500 MG PO TABS
1000.0000 mg | ORAL_TABLET | Freq: Four times a day (QID) | ORAL | Status: DC
  Administered 2022-05-13 (×2): 1000 mg via ORAL
  Filled 2022-05-12 (×2): qty 2

## 2022-05-12 MED ORDER — ACETAMINOPHEN 500 MG PO TABS
1000.0000 mg | ORAL_TABLET | Freq: Four times a day (QID) | ORAL | Status: DC
  Administered 2022-05-12: 1000 mg via ORAL

## 2022-05-12 MED ORDER — BETHANECHOL CHLORIDE 10 MG PO TABS
5.0000 mg | ORAL_TABLET | Freq: Three times a day (TID) | ORAL | Status: DC
  Administered 2022-05-12 – 2022-05-13 (×2): 5 mg via ORAL
  Filled 2022-05-12 (×3): qty 0.5

## 2022-05-12 MED ORDER — IBUPROFEN 800 MG PO TABS: 800.0000 mg | ORAL_TABLET | Freq: Three times a day (TID) | ORAL | 1 refills | Status: AC

## 2022-05-12 MED ORDER — DROPERIDOL 2.5 MG/ML IJ SOLN
0.6250 mg | INTRAMUSCULAR | Status: AC
  Administered 2022-05-12: 0.625 mg via INTRAVENOUS

## 2022-05-12 MED ORDER — SODIUM CHLORIDE 0.9 % IV SOLN: INTRAVENOUS | Status: DC

## 2022-05-12 MED ORDER — BUPIVACAINE HCL 0.5 % IJ SOLN
INTRAMUSCULAR | Status: DC | PRN
  Administered 2022-05-12: 10 mL

## 2022-05-12 MED ORDER — SUGAMMADEX SODIUM 200 MG/2ML IV SOLN
INTRAVENOUS | Status: DC | PRN
  Administered 2022-05-12 (×2): 200 mg via INTRAVENOUS

## 2022-05-12 MED ORDER — KETOROLAC TROMETHAMINE 30 MG/ML IJ SOLN
30.0000 mg | Freq: Three times a day (TID) | INTRAMUSCULAR | Status: DC | PRN
  Administered 2022-05-12 – 2022-05-13 (×2): 30 mg via INTRAVENOUS
  Filled 2022-05-12 (×2): qty 1

## 2022-05-12 MED ORDER — ACETAMINOPHEN 500 MG PO TABS: 1000.0000 mg | ORAL_TABLET | Freq: Once | ORAL | Status: AC

## 2022-05-12 MED ORDER — PROMETHAZINE HCL 25 MG/ML IJ SOLN
INTRAMUSCULAR | Status: AC
  Administered 2022-05-12: 6.25 mg via INTRAVENOUS
  Filled 2022-05-12: qty 1

## 2022-05-12 MED ORDER — DOCUSATE SODIUM 100 MG PO CAPS: 100.0000 mg | ORAL_CAPSULE | Freq: Two times a day (BID) | ORAL | 0 refills | Status: AC

## 2022-05-12 MED ORDER — LACTATED RINGERS IV SOLN: INTRAVENOUS | Status: DC

## 2022-05-12 MED ORDER — ROCURONIUM BROMIDE 100 MG/10ML IV SOLN
INTRAVENOUS | Status: DC | PRN
  Administered 2022-05-12: 50 mg via INTRAVENOUS
  Administered 2022-05-12: 30 mg via INTRAVENOUS
  Administered 2022-05-12: 40 mg via INTRAVENOUS
  Administered 2022-05-12: 50 mg via INTRAVENOUS

## 2022-05-12 MED ORDER — ALBUMIN HUMAN 5 % IV SOLN: INTRAVENOUS | Status: DC | PRN

## 2022-05-12 MED ORDER — OXYCODONE HCL 5 MG/5ML PO SOLN: 5.0000 mg | Freq: Once | ORAL | Status: AC | PRN

## 2022-05-12 MED ORDER — PROPOFOL 500 MG/50ML IV EMUL
INTRAVENOUS | Status: DC | PRN
  Administered 2022-05-12: 150 ug/kg/min via INTRAVENOUS

## 2022-05-12 MED ORDER — FENTANYL CITRATE (PF) 100 MCG/2ML IJ SOLN
25.0000 ug | INTRAMUSCULAR | Status: DC | PRN
  Administered 2022-05-12 (×2): 25 ug via INTRAVENOUS

## 2022-05-12 MED ORDER — SODIUM CHLORIDE 0.9 % IV SOLN: 250.0000 mL | INTRAVENOUS | Status: DC | PRN

## 2022-05-12 MED ORDER — FENTANYL CITRATE (PF) 100 MCG/2ML IJ SOLN
INTRAMUSCULAR | Status: DC | PRN
  Administered 2022-05-12: 100 ug via INTRAVENOUS

## 2022-05-12 MED ORDER — LACTATED RINGERS IR SOLN
Status: DC | PRN
  Administered 2022-05-12: 100 mL

## 2022-05-12 MED ORDER — BETHANECHOL CHLORIDE 10 MG PO TABS
5.0000 mg | ORAL_TABLET | Freq: Three times a day (TID) | ORAL | Status: DC
  Administered 2022-05-12: 5 mg via ORAL
  Filled 2022-05-12: qty 0.5

## 2022-05-12 MED ORDER — OXYCODONE HCL 5 MG PO TABS: 5.0000 mg | ORAL_TABLET | ORAL | 0 refills | Status: AC | PRN

## 2022-05-12 MED ORDER — LIDOCAINE HCL (CARDIAC) PF 100 MG/5ML IV SOSY
PREFILLED_SYRINGE | INTRAVENOUS | Status: DC | PRN
  Administered 2022-05-12: 80 mg via INTRAVENOUS

## 2022-05-12 MED ORDER — SODIUM CHLORIDE 0.9% FLUSH: 3.0000 mL | INTRAVENOUS | Status: DC | PRN

## 2022-05-12 MED ORDER — ACETAMINOPHEN 500 MG PO TABS: 1000.0000 mg | ORAL_TABLET | ORAL | Status: AC

## 2022-05-12 MED ORDER — VASOPRESSIN 20 UNIT/ML IV SOLN
INTRAVENOUS | Status: DC | PRN
  Administered 2022-05-12: 100 mL

## 2022-05-12 MED ORDER — GABAPENTIN 800 MG PO TABS: 800.0000 mg | ORAL_TABLET | Freq: Every day | ORAL | 0 refills | Status: AC

## 2022-05-12 MED ORDER — HYDRALAZINE HCL 20 MG/ML IJ SOLN
INTRAMUSCULAR | Status: AC
  Filled 2022-05-12: qty 1

## 2022-05-12 MED ORDER — POVIDONE-IODINE 10 % EX SWAB
2.0000 | Freq: Once | CUTANEOUS | Status: AC
  Administered 2022-05-12: 2 via TOPICAL

## 2022-05-12 MED ORDER — SILVER NITRATE-POT NITRATE 75-25 % EX MISC
CUTANEOUS | Status: AC
  Filled 2022-05-12: qty 10

## 2022-05-12 MED ORDER — OXYCODONE HCL 5 MG PO TABS
ORAL_TABLET | ORAL | Status: AC
  Administered 2022-05-12: 5 mg via ORAL
  Filled 2022-05-12: qty 1

## 2022-05-12 MED ORDER — OXYCODONE HCL 5 MG PO TABS: 5.0000 mg | ORAL_TABLET | ORAL | Status: DC | PRN

## 2022-05-12 MED ORDER — SODIUM CHLORIDE 0.9 % IR SOLN
Status: DC | PRN
  Administered 2022-05-12: 100 mL

## 2022-05-12 MED ORDER — CEFAZOLIN SODIUM-DEXTROSE 2-4 GM/100ML-% IV SOLN
INTRAVENOUS | Status: AC
  Filled 2022-05-12: qty 100

## 2022-05-12 MED ORDER — BUPIVACAINE HCL (PF) 0.5 % IJ SOLN
INTRAMUSCULAR | Status: AC
  Filled 2022-05-12: qty 30

## 2022-05-12 MED ORDER — DROPERIDOL 2.5 MG/ML IJ SOLN
INTRAMUSCULAR | Status: AC
  Filled 2022-05-12: qty 2

## 2022-05-12 MED ORDER — KETAMINE HCL 10 MG/ML IJ SOLN
INTRAMUSCULAR | Status: DC | PRN
  Administered 2022-05-12: 25 mg via INTRAVENOUS

## 2022-05-12 MED ORDER — ALBUMIN HUMAN 5 % IV SOLN
INTRAVENOUS | Status: AC
  Filled 2022-05-12: qty 500

## 2022-05-12 MED ORDER — LIDOCAINE-EPINEPHRINE 1 %-1:100000 IJ SOLN
INTRAMUSCULAR | Status: DC | PRN
  Administered 2022-05-12: 3 mL
  Administered 2022-05-12: 20 mL

## 2022-05-12 MED ORDER — HEMOSTATIC AGENTS (NO CHARGE) OPTIME
TOPICAL | Status: DC | PRN
  Administered 2022-05-12: 1 via TOPICAL

## 2022-05-12 MED ORDER — SODIUM CHLORIDE 0.9% FLUSH: 3.0000 mL | Freq: Two times a day (BID) | INTRAVENOUS | Status: DC

## 2022-05-12 MED ORDER — CHLORHEXIDINE GLUCONATE 0.12 % MT SOLN: 15.0000 mL | Freq: Once | OROMUCOSAL | Status: AC

## 2022-05-12 MED ORDER — FENTANYL CITRATE (PF) 100 MCG/2ML IJ SOLN
INTRAMUSCULAR | Status: AC
  Administered 2022-05-12: 25 ug via INTRAVENOUS
  Filled 2022-05-12: qty 2

## 2022-05-12 MED ORDER — PROPOFOL 10 MG/ML IV BOLUS
INTRAVENOUS | Status: AC
  Filled 2022-05-12: qty 20

## 2022-05-12 MED ORDER — DEXMEDETOMIDINE HCL IN NACL 200 MCG/50ML IV SOLN
INTRAVENOUS | Status: DC | PRN
  Administered 2022-05-12: 4 ug via INTRAVENOUS
  Administered 2022-05-12: 12 ug via INTRAVENOUS
  Administered 2022-05-12: 8 ug via INTRAVENOUS

## 2022-05-12 MED ORDER — ONDANSETRON HCL 4 MG/2ML IJ SOLN
4.0000 mg | Freq: Once | INTRAMUSCULAR | Status: AC | PRN
  Administered 2022-05-12: 4 mg via INTRAVENOUS

## 2022-05-12 MED ORDER — GABAPENTIN 300 MG PO CAPS
ORAL_CAPSULE | ORAL | Status: AC
  Administered 2022-05-12: 300 mg via ORAL
  Filled 2022-05-12: qty 1

## 2022-05-12 MED ORDER — ACETAMINOPHEN 500 MG PO TABS
ORAL_TABLET | ORAL | Status: AC
  Filled 2022-05-12: qty 1

## 2022-05-12 MED ORDER — ACETAMINOPHEN EXTRA STRENGTH 500 MG PO TABS: 1000.0000 mg | ORAL_TABLET | Freq: Four times a day (QID) | ORAL | 0 refills | Status: AC

## 2022-05-12 MED ORDER — KETAMINE HCL 50 MG/5ML IJ SOSY
PREFILLED_SYRINGE | INTRAMUSCULAR | Status: AC
  Filled 2022-05-12: qty 5

## 2022-05-12 MED ORDER — SODIUM CHLORIDE (PF) 0.9 % IJ SOLN
INTRAMUSCULAR | Status: AC
  Filled 2022-05-12: qty 100

## 2022-05-12 MED ORDER — ONDANSETRON HCL 4 MG/2ML IJ SOLN
INTRAMUSCULAR | Status: AC
  Filled 2022-05-12: qty 2

## 2022-05-12 MED ORDER — LACTATED RINGERS IV SOLN: INTRAVENOUS | Status: DC | PRN

## 2022-05-12 MED ORDER — MIDAZOLAM HCL 2 MG/2ML IJ SOLN
INTRAMUSCULAR | Status: AC
  Filled 2022-05-12: qty 2

## 2022-05-12 MED ORDER — NITROFURANTOIN MONOHYD MACRO 100 MG PO CAPS
100.0000 mg | ORAL_CAPSULE | Freq: Two times a day (BID) | ORAL | Status: DC
  Administered 2022-05-12 – 2022-05-13 (×2): 100 mg via ORAL
  Filled 2022-05-12 (×2): qty 1

## 2022-05-12 MED ORDER — METFORMIN HCL ER 500 MG PO TB24
500.0000 mg | ORAL_TABLET | Freq: Every day | ORAL | Status: DC
  Administered 2022-05-13: 500 mg via ORAL
  Filled 2022-05-12: qty 1

## 2022-05-12 MED ORDER — LABETALOL HCL 5 MG/ML IV SOLN
INTRAVENOUS | Status: DC | PRN
  Administered 2022-05-12: 5 mg via INTRAVENOUS
  Administered 2022-05-12: 10 mg via INTRAVENOUS
  Administered 2022-05-12: 5 mg via INTRAVENOUS

## 2022-05-12 MED ORDER — DEXAMETHASONE SODIUM PHOSPHATE 10 MG/ML IJ SOLN
INTRAMUSCULAR | Status: DC | PRN
  Administered 2022-05-12: 10 mg via INTRAVENOUS

## 2022-05-12 MED ORDER — FENTANYL CITRATE (PF) 100 MCG/2ML IJ SOLN
INTRAMUSCULAR | Status: AC
  Filled 2022-05-12: qty 2

## 2022-05-12 MED ORDER — ONDANSETRON HCL 4 MG/2ML IJ SOLN
INTRAMUSCULAR | Status: DC | PRN
  Administered 2022-05-12: 4 mg via INTRAVENOUS

## 2022-05-12 MED ORDER — PROMETHAZINE HCL 25 MG/ML IJ SOLN: 6.2500 mg | INTRAMUSCULAR | Status: AC | PRN

## 2022-05-12 MED ORDER — CEFAZOLIN SODIUM-DEXTROSE 2-4 GM/100ML-% IV SOLN
2.0000 g | INTRAVENOUS | Status: AC
  Administered 2022-05-12: 2 g via INTRAVENOUS

## 2022-05-12 MED ORDER — PROPOFOL 10 MG/ML IV BOLUS
INTRAVENOUS | Status: DC | PRN
  Administered 2022-05-12: 150 mg via INTRAVENOUS

## 2022-05-12 MED ORDER — 0.9 % SODIUM CHLORIDE (POUR BTL) OPTIME
TOPICAL | Status: DC | PRN
  Administered 2022-05-12: 200 mL

## 2022-05-12 MED ORDER — SODIUM CHLORIDE 0.9 % IV SOLN
12.5000 mg | Freq: Once | INTRAVENOUS | Status: DC
  Filled 2022-05-12: qty 0.5

## 2022-05-12 MED ORDER — CHLORHEXIDINE GLUCONATE 0.12 % MT SOLN
OROMUCOSAL | Status: AC
  Administered 2022-05-12: 15 mL via OROMUCOSAL
  Filled 2022-05-12: qty 15

## 2022-05-12 MED ORDER — OXYCODONE HCL 5 MG PO TABS: 5.0000 mg | ORAL_TABLET | Freq: Once | ORAL | Status: AC | PRN

## 2022-05-12 MED ORDER — GABAPENTIN 300 MG PO CAPS: 300.0000 mg | ORAL_CAPSULE | ORAL | Status: AC

## 2022-05-12 MED ORDER — VASOPRESSIN 20 UNIT/ML IV SOLN
INTRAVENOUS | Status: AC
  Filled 2022-05-12: qty 1

## 2022-05-12 MED ORDER — ONDANSETRON 4 MG PO TBDP: 4.0000 mg | ORAL_TABLET | Freq: Three times a day (TID) | ORAL | 0 refills | Status: AC | PRN

## 2022-05-12 MED ORDER — PHENYLEPHRINE HCL (PRESSORS) 10 MG/ML IV SOLN
INTRAVENOUS | Status: DC | PRN
  Administered 2022-05-12: 80 ug via INTRAVENOUS

## 2022-05-12 SURGICAL SUPPLY — 72 items
APPLICATOR ARISTA FLEXITIP XL (MISCELLANEOUS) IMPLANT
BAG URINE DRAIN 2000ML AR STRL (UROLOGICAL SUPPLIES) ×2 IMPLANT
BLADE SURG 15 STRL LF DISP TIS (BLADE) ×2 IMPLANT
BLADE SURG 15 STRL SS (BLADE) ×2
BLADE SURG SZ11 CARB STEEL (BLADE) ×2 IMPLANT
CATH FOLEY 2WAY  5CC 16FR (CATHETERS) ×2
CATH ROBINSON RED A/P 16FR (CATHETERS) IMPLANT
CATH URTH 16FR FL 2W BLN LF (CATHETERS) ×2 IMPLANT
DERMABOND ADVANCED .7 DNX12 (GAUZE/BANDAGES/DRESSINGS) ×2 IMPLANT
DRAPE GENERAL ENDO 106X123.5 (DRAPES) ×2 IMPLANT
DRAPE PERI LITHO V/GYN (MISCELLANEOUS) ×2 IMPLANT
DRAPE UNDER BUTTOCK W/FLU (DRAPES) ×2 IMPLANT
ELECT REM PT RETURN 9FT ADLT (ELECTROSURGICAL) ×2
ELECTRODE REM PT RTRN 9FT ADLT (ELECTROSURGICAL) ×2 IMPLANT
ETHIBOND 2 0 GREEN CT 2 30IN (SUTURE) ×4 IMPLANT
GAUZE 4X4 16PLY ~~LOC~~+RFID DBL (SPONGE) ×6 IMPLANT
GAUZE PACK 2X3YD (PACKING) ×2 IMPLANT
GLOVE BIO SURGEON STRL SZ7 (GLOVE) ×6 IMPLANT
GLOVE SURG SYN 8.0 (GLOVE) ×2 IMPLANT
GLOVE SURG SYN 8.0 PF PI (GLOVE) ×2 IMPLANT
GLOVE SURG UNDER LTX SZ7.5 (GLOVE) ×6 IMPLANT
GOWN STRL REUS W/ TWL LRG LVL3 (GOWN DISPOSABLE) ×6 IMPLANT
GOWN STRL REUS W/ TWL XL LVL3 (GOWN DISPOSABLE) ×2 IMPLANT
GOWN STRL REUS W/TWL LRG LVL3 (GOWN DISPOSABLE) ×6
GOWN STRL REUS W/TWL XL LVL3 (GOWN DISPOSABLE) ×2
HEMOSTAT ARISTA ABSORB 3G PWDR (HEMOSTASIS) IMPLANT
IRRIGATION STRYKERFLOW (MISCELLANEOUS) ×2 IMPLANT
IRRIGATOR STRYKERFLOW (MISCELLANEOUS) ×2
IV LACTATED RINGERS 1000ML (IV SOLUTION) ×2 IMPLANT
KIT TURNOVER CYSTO (KITS) ×2 IMPLANT
LABEL OR SOLS (LABEL) ×2 IMPLANT
LIGASURE LAP MARYLAND 5MM 37CM (ELECTROSURGICAL) IMPLANT
MANIFOLD NEPTUNE II (INSTRUMENTS) ×2 IMPLANT
NDL EPID TUOHY 18GX6 (NEEDLE) ×2 IMPLANT
NDL SPNL 18GX3.5 QUINCKE PK (NEEDLE) ×2 IMPLANT
NDL SPNL 22GX3.5 QUINCKE BK (NEEDLE) ×2 IMPLANT
NEEDLE EPID TUOHY 18GX6 (NEEDLE) ×2 IMPLANT
NEEDLE HYPO 22GX1.5 SAFETY (NEEDLE) ×2 IMPLANT
NEEDLE SPNL 18GX3.5 QUINCKE PK (NEEDLE) ×2 IMPLANT
NEEDLE SPNL 22GX3.5 QUINCKE BK (NEEDLE) ×2 IMPLANT
NS IRRIG 1000ML POUR BTL (IV SOLUTION) IMPLANT
PACK BASIN MINOR ARMC (MISCELLANEOUS) ×2 IMPLANT
PACK GYN LAPAROSCOPIC (MISCELLANEOUS) ×2 IMPLANT
PAD PREP 24X41 OB/GYN DISP (PERSONAL CARE ITEMS) ×2 IMPLANT
SCRUB CHG 4% DYNA-HEX 4OZ (MISCELLANEOUS) ×2 IMPLANT
SET CYSTO W/LG BORE CLAMP LF (SET/KITS/TRAYS/PACK) ×2 IMPLANT
SLEEVE Z-THREAD 5X100MM (TROCAR) ×2 IMPLANT
SLING TVT EXACT (Sling) ×2 IMPLANT
SOL SCRUB PVP POV-IOD 4OZ 7.5% (MISCELLANEOUS) ×2
SOLUTION SCRB POV-IOD 4OZ 7.5% (MISCELLANEOUS) ×2 IMPLANT
STRIP CLOSURE SKIN 1/2X4 (GAUZE/BANDAGES/DRESSINGS) ×2 IMPLANT
SUT PDS AB 2-0 CT1 27 (SUTURE) ×2 IMPLANT
SUT VIC AB 0 CT1 27 (SUTURE) ×4
SUT VIC AB 0 CT1 27XCR 8 STRN (SUTURE) ×2 IMPLANT
SUT VIC AB 0 CT1 36 (SUTURE) ×6 IMPLANT
SUT VIC AB 2-0 CT1 (SUTURE) ×2 IMPLANT
SUT VIC AB 2-0 CT1 36 (SUTURE) ×4 IMPLANT
SUT VIC AB 2-0 SH 27 (SUTURE) ×6
SUT VIC AB 2-0 SH 27XBRD (SUTURE) ×6 IMPLANT
SUT VIC AB 2-0 UR6 27 (SUTURE) ×2 IMPLANT
SUT VIC AB 3-0 SH 27 (SUTURE) ×2
SUT VIC AB 3-0 SH 27X BRD (SUTURE) ×2 IMPLANT
SUT VIC AB 4-0 FS2 27 (SUTURE) ×2 IMPLANT
SUT VIC AB 4-0 SH 27 (SUTURE) ×2
SUT VIC AB 4-0 SH 27XANBCTRL (SUTURE) ×2 IMPLANT
SWABSTK COMLB BENZOIN TINCTURE (MISCELLANEOUS) ×2 IMPLANT
SYR 10ML LL (SYRINGE) ×2 IMPLANT
SYR 30ML LL (SYRINGE) IMPLANT
SYR CONTROL 10ML LL (SYRINGE) ×2 IMPLANT
TROCAR XCEL NON-BLD 5MMX100MML (ENDOMECHANICALS) ×2 IMPLANT
TROCAR Z-THRD FIOS HNDL 11X100 (TROCAR) ×2 IMPLANT
TUBING EVAC SMOKE HEATED PNEUM (TUBING) ×2 IMPLANT

## 2022-05-12 NOTE — Transfer of Care (Addendum)
Immediate Anesthesia Transfer of Care Note  Patient: ESTHELA BRANDNER  Procedure(s) Performed: LAPAROSCOPIC ASSISTED VAGINAL HYSTERECTOMY WITH SALPINGO OOPHORECTOMY (Bilateral) ANTERIOR REPAIR (CYSTOCELE) TRANSVAGINAL TAPE (TVT) PROCEDURE  Patient Location: PACU  Anesthesia Type:General  Level of Consciousness: drowsy  Airway & Oxygen Therapy: Patient Spontanous Breathing and Patient connected to face mask oxygen  Post-op Assessment: Report given to RN and Post -op Vital signs reviewed and stable  Post vital signs: Reviewed and stable  Last Vitals:  Vitals Value Taken Time  BP    Temp    Pulse    Resp    SpO2      Last Pain:  Vitals:   05/12/22 0627  TempSrc: Temporal  PainSc: 0-No pain         Complications: No notable events documented.

## 2022-05-12 NOTE — Op Note (Signed)
NAME: Haley Wolfe, Haley Wolfe MEDICAL RECORD NO: [id number] ACCOUNT NO: [id number] DATE OF BIRTH: 11/29/1965 FACILITY: [facility] LOCATION: [facility] PHYSICIAN: Boykin Nearing, MD  Operative Report   DATE OF PROCEDURE: 05/12/2022  Please note, there will be another operation report by Dr. Benjaman Kindler that accompanies this report.  PREOPERATIVE DIAGNOSIS:  Stress urinary incontinence.  POSTOPERATIVE DIAGNOSES:  Stress urinary incontinence.  PROCEDURE:  Tension-free vaginal tape exact urethropexy.  NAME OF SURGEON:  Boykin Nearing, MD  FIRST ASSISTANT:  Benjaman Kindler, MD  SECOND ASSISTANT:  PA student.  ANESTHESIA:  General endotracheal anesthesia.  INDICATIONS:  A 56 year old female with long history of stress urinary incontinence, worse with standing and performing exercising.  Office cystometrics confirm leakage of fluid with the standing position with Valsalva and cough.  Of note, the patient  has abnormal uterine bleeding and is undergoing a concurrent laparoscopic-assisted vaginal hysterectomy, bilateral salpingo-oophorectomy by Dr. Leafy Ro as primary surgeon and me as assistant at the same time of my procedure.  DESCRIPTION OF PROCEDURE:  After Dr. Leafy Ro finished laparoscopic-assisted vaginal hysterectomy and bilateral salpingo-oophorectomy the patient was already positioned in the supine position with the legs in the [place].  The patient's bladder was  drained with the Foley catheter and the patient's suprapubic area was marked for exit sites of the TVT EXACT.  This was approximately 1.5 cm away from the midline on both sides.  Attention was directed to the patient's vagina where an injection of  lidocaine with epinephrine 1:100,000 was injected from 1 cm to 3 cm posterior from the urethral meatus.  Allis clamps were used to stabilize the tissue off the midline and 2 cm incision was made again, starting 1 cm posterior to the urethral meatus.   Metzenbaum  scissors were used to dissect periurethrally.  Hydrodissection was then performed suprapubically through the previously marked areas utilizing 50 mL of normal saline with vasopressin and a solution of 20 units of vasopressin in 100 mL.  A 50  mL was injected both suprapubically bilaterally.  The patient's bladder was then catheterized with a Foley catheter with the guidewire and pushing the uterus posterior and to the contralateral side while the TVT EXACT was placed on the patient's right  side, entering from the periurethral space that was previously dissected and guiding it suprapubically through the exit point approximately 1.5 cm off the midline.  A trocar was left in place and the guidewire and the Foley were removed and cystoscopy  was performed demonstrating no trocar in the bladder tissue.  Of note, both ureteral orifices were identified with normal pluming of urine.  The cystoscope was removed, bladder was drained.  The Foley catheter was placed again with a guidewire and then  starting the patient's left side and pushing the bladder to the right posterior, the exact trocar was then guided again periurethrally suprapubically and exiting  1.5 cm off midline.  The trocar was left in place again and the Foley catheter with guidewire were  removed and a repeat cystoscopy was performed again noting that there was no defect in the urethra or the bladder.  Bladder was drained and then the TVT trocars were extended and advanced through the suprapubic area and then the trocars were clamped and  removed.  The sheath was then grasped to elevate the TVT tape to be positioned at the mid urethral portion of the urethra.  A 16 Hanks dilator was used to give space between the urethra and the mesh and the sleeves were  then removed, resulting in just  the TVT Prolene tape in place.  There was a small amount of space between i.e., not to be too snug and not to overtighten underneath the mid urethra.  Good hemostasis  was noted at this point and the vaginal epithelium was then closed with a running 3-0  Vicryl suture.  A 16-gauge Hanks dilator easily advanced in the urethra as not to obstruct or misdirect the urethra with the tape, which was normal.  The tapes were trimmed suprapubically and retracted beneath the skin and the skin was closed with a 4-0  Vicryl suture.  Dermabond placed over top of this.  There were no complications.  The patient tolerated the procedure well.  Again, please reference Dr. Migdalia Dk operation report for the first part of the combined procedure.   PUS D: 05/12/2022 12:44:29 pm T: 05/12/2022 2:14:00 pm  JOB: [id number]/ [id number]

## 2022-05-12 NOTE — Progress Notes (Signed)
Pt is scheduled for combination LAVH / BSO and TVT urethropexy . Labs reviewed . All questions answered . Proceed

## 2022-05-12 NOTE — Progress Notes (Signed)
Pt is still with urinary retention > output .  Foley placed for overnight and will voiding trial in am  Repeat labs in am . On urecholine to improve bladder tone  Macrobid bid given multiple catheterizations today .

## 2022-05-12 NOTE — Op Note (Signed)
Palin Tristan [facility] PROCEDURE DATE: 05/12/2022  PREOPERATIVE DIAGNOSIS: Abnormal uterine bleeding, known fibroids, failed conservative management POSTOPERATIVE DIAGNOSIS: The same  PROCEDURE: Laparoscopic assisted vaginal hysterectomy, bilateral salpingoophorectomy. Dr. Ouida Sills assisted for this portion of the case, and I assisted him for the TVT sling portion  SURGEON:  Dr. Angelina Pih  ASSISTANT: Dr. Gwen Her Schermerhorn ANESTHESIOLOGIST: Arita Miss, MD Anesthesiologist: Arita Miss, MD CRNA: Lia Foyer, CRNA; Beverely Low, CRNA; Hedda Slade, CRNA  INDICATIONS: 56 y.o. F with aforementioned preoperative diagnoses here today for definitive surgical management.   Risks of surgery were discussed with the patient including but not limited to: bleeding which may require transfusion or reoperation; infection which may require antibiotics; injury to bowel, bladder, ureters or other surrounding organs; need for additional procedures including laparotomy; thromboembolic phenomenon, incisional problems and other postoperative/anesthesia complications. Written informed consent was obtained.    FINDINGS: Enlarged boggy uterus with multiple intramural fibroids.  Bilaterally normal tubes and ovaries.  Normal upper abdomen, with normal liver.  Otherwise pelvic anatomy unremarkable.  ANESTHESIA:    General INTRAVENOUS FLUIDS: 1200 ml of LR and 556m of albumin  ESTIMATED BLOOD LOSS: 850 ml SPECIMENS: Uterus, cervix, bilateral fallopian tubes and ovaries. COMPLICATIONS: None immediate  LAVH/BSO: Laparoscopy to just taking the adnexa and completing vaginally  PROCEDURE IN DETAIL:  The patient received intravenous antibiotics and had sequential compression devices applied to her lower extremities while in the preoperative area.  She was then taken to the operating room where general anesthesia was administered and was found to be adequate.  She was placed in the dorsal lithotomy position,  and was prepped and draped in a sterile manner.  A Foley catheter was inserted into her bladder and attached to constant drainage and a uterine manipulator was then advanced into the uterus.  After an adequate timeout was performed, attention was turned to the abdomen where an umbilical incision was made with the scalpel.  A 5-mm trocar and sleeve were then advanced without difficulty with the laparoscope under direct visualization into the abdomen.  The abdomen was then insufflated with carbon dioxide gas and adequate pneumoperitoneum was obtained. Bilateral 5-mm lower quadrant ports were then placed under direct visualization.  A survey of the patient's pelvis and abdomen revealed the findings as above.  The round ligaments were clamped and transected with the Ligasure device on both sides. The bilateral infundibulopelvic ligaments were also clamped and transected with the Ligasure device.  Excellent hemostasis was noted, the decision was made to leave the trocars in place and proceed with completing the hysterectomy via the vaginal route .  Attention was then turned to her pelvis.  A weighted speculum was then placed in the vagina, and the anterior and posterior lips of the cervix were grasped bilaterally with tenaculums.  The cervix was then injected circumferentially with 0.5% Marcaine with epinephrine solution to maintain hemostasis.  The cervix was then circumferentially incised, and the anterior cul-de-sac was then entered sharply without difficulty and a retractor was placed.  The same procedure was performed posteriorly and the posterior cul-de-sac was entered sharply without difficulty.  A long weighted speculum was inserted into the posterior cul-de-sac.  The Heaney clamp was then used to clamp the uterosacral ligaments on either side.  They were then cut and sutured ligated with 0 Vicryl, and were held with a tag for later identification. Of note, all sutures used in this case were 0 Vicryl unless  otherwise noted.   The cardinal ligaments were then  clamped, cut and ligated bilaterally. The uterine vessels and broad ligaments were then serially clamped with the Heaney clamps, cut, and suture ligated on both sides.  The uterus was noted to be freed from all ligaments and was then significant morcellation was required.  On the right most distal portion of the uterus where it was still attached, a small avulsion led to significant bleeding.  This area was clamped, and attention returned to the abdomen for excellent visualization.  Using suction and the Kleppinger, the vasculature was identified and cauterized, with good results.  Both ureters were noted to be pulsing outside of the field.  This is where most of the blood loss in this case occurred, and control was obtained shortly after returning to the laparoscopic visualization.  Attention was returned to the vaginal canal, the uterus was cored out and delivered.  The specimen was sent to pathology.  After completion of the hysterectomy, all pedicles from the uterosacral ligament to the cornua were examined hemostasis was confirmed.  A single right sided figure-of-eight was placed.  The vaginal cuff was then closed in a running locked fashion with 0 Vicryl with care given to incorporate the uterosacral pedicles bilaterally.  All instruments were then removed from the pelvis.   Attention was then returned to her abdomen which was insufflated again with carbon dioxide gas.  The laparoscope was used to survey the operative site, and it was found to be hemostatic.   No intraoperative injury to other surrounding organs was noted. Arista placed for good measure.  The abdomen was desufflated and all instruments were then removed from the patient's abdomen.   All skin incisions were closed with 4-0  mononcryl and Dermabond. The patient tolerated the procedures well, with a brief drop in blood pressure during her active bleeding, leading anesthesia to hang the  albumin. The rest of the case was unremarkable.  Attention was turned to the vagina and bladder, where Dr. Ouida Sills performed the mid urethral sling procedure.  Please see his operative note for that portion.  All instruments, needles, and sponge counts were correct x 2. The patient was taken to the recovery room awake, extubated and in stable condition.   In the PACU we will do a voiding trial with a postvoid residual, and I will check her CBC and kidney function with a BMP prior to discharge.

## 2022-05-12 NOTE — Discharge Instructions (Addendum)
Discharge instructions after   total laparoscopic assisted vaginal hysterectomy   For the next three days, take ibuprofen and acetaminophen on a schedule, every 8 hours. You can take them together or you can intersperse them, and take one every four hours. I also gave you gabapentin for nighttime, to help you sleep and also to control pain. Take gabapentin medicines at night for at least the next 3 nights. You also have a narcotic, oxycodone, to take as needed if the above medicines don't help.  Postop constipation is a major cause of pain. Stay well hydrated, walk as you tolerate, and take over the counter senna as well as stool softeners if you need them.    Signs and Symptoms to Report Call our office at [phone] if you have any of the following.   Fever over 100.4 degrees or higher  Severe stomach pain not relieved with pain medications  Bright red bleeding that's heavier than a period that does not slow with rest  To go the bathroom a lot (frequency), you can't hold your urine (urgency), or it hurts when you empty your bladder (urinate)  Chest pain  Shortness of breath  Pain in the calves of your legs  Severe nausea and vomiting not relieved with anti-nausea medications  Signs of infection around your wounds, such as redness, hot to touch, swelling, green/yellow drainage (like pus), bad smelling discharge  Any concerns  What You Can Expect after Surgery  You may see some pink tinged, bloody fluid and bruising around the wound. This is normal.  You may notice shoulder and neck pain. This is caused by the gas used during surgery to expand your abdomen so your surgeon could get to the uterus easier.  You may have a sore throat because of the tube in your mouth during general anesthesia. This will go away in 2 to 3 days.  You may have some stomach cramps.  You may notice spotting on your panties.  You may have pain around the incision sites.   Activities after Your  Discharge Follow these guidelines to help speed your recovery at home:  Do the coughing and deep breathing as you did in the hospital for 2 weeks. Use the small blue breathing device, called the incentive spirometer for 2 weeks.  Don't drive if you are in pain or taking narcotic pain medicine. You may drive when you can safely slam on the brakes, turn the wheel forcefully, and rotate your torso comfortably. This is typically 1-2 weeks. Practice in a parking lot or side street prior to attempting to drive regularly.   Ask others to help with household chores for 4 weeks.  Do not lift anything heavier that 10 pounds for 4-6 weeks. This includes pets, children, and groceries.  Don't do strenuous activities, exercises, or sports like vacuuming, tennis, squash, etc. until your doctor says it is safe to do so. ---Maintain pelvic rest for 8 weeks. This means nothing in the vagina or rectum at all (no douching, tampons, intercourse) for 8 weeks.   Walk as you feel able. Rest often since it may take two or three weeks for your energy level to return to normal.   You may climb stairs  Avoid constipation:   -Eat fruits, vegetables, and whole grains. Eat small meals as your appetite will take time to return to normal.   -Drink 6 to 8 glasses of water each day unless your doctor has told you to limit your fluids.   -Use  a laxative or stool softener as needed if constipation becomes a problem. You may take Miralax, metamucil, Citrucil, Colace, Senekot, FiberCon, etc. If this does not relieve the constipation, try two tablespoons of Milk Of Magnesia every 8 hours until your bowels move.   You may shower. Gently wash the wounds with a mild soap and water. Pat dry.  Do not get in a hot tub, swimming pool, etc. for 6 weeks.  Do not use lotions, oils, powders on the wounds.  Do not douche, use tampons, or have sex until your doctor says it is okay.  Take your pain medicine when you need it. The medicine may not work  as well if the pain is bad.  Take the medicines you were taking before surgery. Other medications you will need are pain medications and possibly constipation and nausea medications (Zofran).      AMBULATORY SURGERY  DISCHARGE INSTRUCTIONS   The drugs that you were given will stay in your system until tomorrow so for the next 24 hours you should not:  Drive an automobile Make any legal decisions Drink any alcoholic beverage   You may resume regular meals tomorrow.  Today it is better to start with liquids and gradually work up to solid foods.  You may eat anything you prefer, but it is better to start with liquids, then soup and crackers, and gradually work up to solid foods.   Please notify your doctor immediately if you have any unusual bleeding, trouble breathing, redness and pain at the surgery site, drainage, fever, or pain not relieved by medication.    Additional Instructions:        Please contact your physician with any problems or Same Day Surgery at [phone], Monday through Friday 6 am to 4 pm, or [place] [facility] number at [phone].

## 2022-05-12 NOTE — Anesthesia Procedure Notes (Signed)
Procedure Name: Intubation Date/Time: 05/12/2022 7:45 AM  Performed by: Beverely Low, CRNAPre-anesthesia Checklist: Patient identified, Patient being monitored, Timeout performed, Emergency Drugs available and Suction available Patient Re-evaluated:Patient Re-evaluated prior to induction Oxygen Delivery Method: Circle system utilized Preoxygenation: Pre-oxygenation with 100% oxygen Induction Type: IV induction Ventilation: Mask ventilation without difficulty Laryngoscope Size: 3 and McGraph Grade View: Grade I Tube type: Oral Tube size: 6.5 mm Number of attempts: 1 Airway Equipment and Method: Stylet Placement Confirmation: ETT inserted through vocal cords under direct vision, positive ETCO2 and breath sounds checked- equal and bilateral Secured at: 21 cm Tube secured with: Tape Dental Injury: Teeth and Oropharynx as per pre-operative assessment

## 2022-05-12 NOTE — Anesthesia Postprocedure Evaluation (Signed)
Anesthesia Post Note  Patient: [name]  Procedure(s) Performed: LAPAROSCOPIC ASSISTED VAGINAL HYSTERECTOMY WITH SALPINGO OOPHORECTOMY (Bilateral) TRANSVAGINAL TAPE (TVT) PROCEDURE CYSTOSCOPY  Patient location during evaluation: PACU Anesthesia Type: General Level of consciousness: awake and alert Pain management: pain level controlled Vital Signs Assessment: post-procedure vital signs reviewed and stable Respiratory status: spontaneous breathing, nonlabored ventilation, respiratory function stable and patient connected to nasal cannula oxygen Cardiovascular status: blood pressure returned to baseline and stable Postop Assessment: no apparent nausea or vomiting Anesthetic complications: no   No notable events documented.   Last Vitals:  Vitals:   05/12/22 1230 05/12/22 1245  BP: 124/78 (!) 135/90  Pulse: 93 82  Resp: 20 14  Temp:  (!) 36.2 C  SpO2: 100% 100%    Last Pain:  Vitals:   05/12/22 1253  TempSrc:   PainSc: 7                  Arita Miss

## 2022-05-12 NOTE — Brief Op Note (Signed)
05/12/2022  12:32 PM  PATIENT:  Haley Wolfe  56 y.o. female  PRE-OPERATIVE DIAGNOSIS:  postmenopausal bleeding, stress urinary incontinence, cystocele  POST-OPERATIVE DIAGNOSIS:  postmenopausal bleeding, stress urinary incontinence, cystocele  PROCEDURE:  TVT exact urethropexy   SURGEON:  Surgeon(s) and Role:    *Latwan Luchsinger- primary     Beasley  - assisting  PHYSICIAN ASSISTANT:   ASSISTANTS: pa studnet   ANESTHESIA:   general  EBL: 75 cc ( my portion of the procedure only)  BLOOD ADMINISTERED:none  DRAINS: none   LOCAL MEDICATIONS USED:  LIDOCAINE   SPECIMEN:  No Specimen  DISPOSITION OF SPECIMEN:  N/A  COUNTS:  YES  TOURNIQUET:  * No tourniquets in log *  DICTATION: .Other Dictation: Dictation Number verbal  PLAN OF CARE: Discharge to home after PACU  PATIENT DISPOSITION:  PACU - hemodynamically stable.   Delay start of Pharmacological VTE agent (>24hrs) due to surgical blood loss or risk of bleeding: not applicable

## 2022-05-12 NOTE — Anesthesia Preprocedure Evaluation (Signed)
Anesthesia Evaluation  Patient identified by MRN, date of birth, ID band Patient awake  General Assessment Comment:  Gets motion sick very easily  Reviewed: Allergy & Precautions, NPO status , Patient's Chart, lab work & pertinent test results  History of Anesthesia Complications (+) PONV and history of anesthetic complications  Airway Mallampati: II  TM Distance: >3 FB Neck ROM: Full    Dental no notable dental hx. (+) Teeth Intact   Pulmonary neg pulmonary ROS, neg sleep apnea, neg COPD, Patient abstained from smoking.Not current smoker,    Pulmonary exam normal breath sounds clear to auscultation       Cardiovascular Exercise Tolerance: Good METS(-) hypertension(-) CAD and (-) Past MI negative cardio ROS  (-) dysrhythmias  Rhythm:Regular Rate:Normal - Systolic murmurs    Neuro/Psych negative neurological ROS  negative psych ROS   GI/Hepatic neg GERD  ,(+)     (-) substance abuse  ,   Endo/Other  diabetes, Well ControlledOn trulicity, last taken 1 week ago. (held appropriately)  Renal/GU negative Renal ROS     Musculoskeletal   Abdominal   Peds  Hematology   Anesthesia Other Findings Past Medical History: No date: Arthritis No date: Cancer ([place])     Comment:  melanoma No date: Diabetes mellitus without complication (HCC) No date: Female stress incontinence No date: Hepatic lesion No date: HLD (hyperlipidemia) No date: Melanoma ([place]) No date: Tubular adenoma of colon No date: Uterovaginal prolapse  Reproductive/Obstetrics                             Anesthesia Physical Anesthesia Plan  ASA: 2  Anesthesia Plan: General   Post-op Pain Management: Tylenol PO (pre-op)*, Gabapentin PO (pre-op)* and Toradol IV (intra-op)*   Induction: Intravenous  PONV Risk Score and Plan: 4 or greater and Ondansetron, Dexamethasone, Midazolam, TIVA and Propofol infusion  Airway Management  Planned: Oral ETT  Additional Equipment: None  Intra-op Plan:   Post-operative Plan: Extubation in OR  Informed Consent: I have reviewed the patients History and Physical, chart, labs and discussed the procedure including the risks, benefits and alternatives for the proposed anesthesia with the patient or authorized representative who has indicated his/her understanding and acceptance.     Dental advisory given  Plan Discussed with: CRNA and Surgeon  Anesthesia Plan Comments: (Discussed risks of anesthesia with patient, including PONV, sore throat, lip/dental/eye damage. Rare risks discussed as well, such as cardiorespiratory and neurological sequelae, and allergic reactions. Discussed the role of CRNA in patient's perioperative care. Patient understands. Patient has listed allergy to PCN - rash decades ago Severe blistering skin reaction (SJS/TEN)? no Liver or kidney injury caused by PCN? no Hemolytic anemia from PCN? no Drug fever? no Painful swollen joints? no Severe reaction involving inside of mouth, eye, or genital ulcers? no Based on current evidence Alfonse Alpers et al, J Allergy Clin Immunol Pract, 2019), will proceed with cefazolin use: Yes  )        Anesthesia Quick Evaluation

## 2022-05-13 ENCOUNTER — Encounter: Payer: Self-pay | Admitting: Obstetrics and Gynecology

## 2022-05-13 DIAGNOSIS — N393 Stress incontinence (female) (male): Secondary | ICD-10-CM | POA: Diagnosis not present

## 2022-05-13 LAB — BASIC METABOLIC PANEL
Anion gap: 5 (ref 5–15)
BUN: 9 mg/dL (ref 6–20)
CO2: 24 mmol/L (ref 22–32)
Calcium: 7.9 mg/dL — ABNORMAL LOW (ref 8.9–10.3)
Chloride: 104 mmol/L (ref 98–111)
Creatinine, Ser: 0.53 mg/dL (ref 0.44–1.00)
GFR, Estimated: >60 mL/min (ref >60)
Glucose, Bld: 140 mg/dL — ABNORMAL HIGH (ref 70–99)
Potassium: 3.4 mmol/L — ABNORMAL LOW (ref 3.5–5.1)
Sodium: 133 mmol/L — ABNORMAL LOW (ref 135–145)

## 2022-05-13 LAB — CBC
HCT: 25.2 % — ABNORMAL LOW (ref 36.0–46.0)
Hemoglobin: 9.2 g/dL — ABNORMAL LOW (ref 12.0–15.0)
MCH: 31.3 pg (ref 26.0–34.0)
MCHC: 36.5 g/dL — ABNORMAL HIGH (ref 30.0–36.0)
MCV: 85.7 fL (ref 80.0–100.0)
Platelets: 164 10*3/uL (ref 150–400)
RBC: 2.94 MIL/uL — ABNORMAL LOW (ref 3.87–5.11)
RDW: 12 % (ref 11.5–15.5)
WBC: 8.3 10*3/uL (ref 4.0–10.5)
nRBC: 0 % (ref 0.0–0.2)

## 2022-05-13 LAB — SURGICAL PATHOLOGY

## 2022-05-13 MED ORDER — BETHANECHOL CHLORIDE 5 MG PO TABS: 5.0000 mg | ORAL_TABLET | Freq: Three times a day (TID) | ORAL | 0 refills | Status: AC

## 2022-05-13 MED ORDER — SODIUM CHLORIDE 0.9 % IV SOLN
300.0000 mg | Freq: Once | INTRAVENOUS | Status: AC
  Administered 2022-05-13: 300 mg via INTRAVENOUS
  Filled 2022-05-13: qty 300

## 2022-05-13 NOTE — Progress Notes (Signed)
Patient discharged home with husband.  Discharge instructions, when to follow up, and prescriptions reviewed with patient.  Patient verbalized understanding. Patient will be escorted out by auxiliary.

## 2022-05-17 NOTE — Discharge Summary (Signed)
Physician Discharge Summary  Patient ID: Haley Wolfe MRN: [id number] DOB/AGE: [birth year] 56 y.o.  Admit date: 05/12/2022 Discharge date: 05/13/22  Admission Diagnoses:s/p LAVH / BSO and TVT urtheropexy  Urinary retention  Discharge Diagnoses: same as above Principal Problem:   Postoperative state   Discharged Condition: good  Hospital Course: unable to void adequate amount post op . Foley catheter placed and she was placed on urecholine 5 mg . In am foley removed and pt void > residual . D/C home  Consults: none  Significant Diagnostic Studies:  Treatments: surgery as above   Discharge Exam: Blood pressure 112/61, pulse 78, temperature 98.6 F (37 C), temperature source Oral, resp. rate 16, height '5\' 6"'$  (1.676 m), weight 72.6 kg, last menstrual period 04/12/2022, SpO2 98 %. General appearance: alert and cooperative Head: atraumatic Resp: clear to auscultation bilaterally Cardio: regular rate and rhythm, S1, S2 normal, no murmur, click, rub or gallop GI: soft, non-tender; bowel sounds normal; no masses,  no organomegaly  Disposition: Discharge disposition: 01-Home or Self Care       Discharge Instructions     Activity as tolerated - No restrictions   Complete by: As directed    Call MD for:   Complete by: As directed    Heavy vaginal bleeding , difficulty draining bladder   Call MD for:  difficulty breathing, headache or visual disturbances   Complete by: As directed    Call MD for:  extreme fatigue   Complete by: As directed    Call MD for:  hives   Complete by: As directed    Call MD for:  persistant dizziness or light-headedness   Complete by: As directed    Call MD for:  persistant nausea and vomiting   Complete by: As directed    Call MD for:  redness, tenderness, or signs of infection (pain, swelling, redness, odor or green/yellow discharge around incision site)   Complete by: As directed    Call MD for:  severe uncontrolled pain   Complete by: As  directed    Call MD for:  temperature >100.4   Complete by: As directed    Call MD for:  temperature >100.4   Complete by: As directed    Diet general   Complete by: As directed    Discharge instructions   Complete by: As directed    See specialty specific instructions   Increase activity slowly   Complete by: As directed    Increase activity slowly   Complete by: As directed    No wound care   Complete by: As directed    Sexual Activity Restrictions   Complete by: As directed    Maintain pelvic rest until postop visit in 2 weeks      Allergies as of 05/13/2022       Reactions   Penicillins Rash   Tolerated cefazolin        Medication List     TAKE these medications    ALPRAZolam 0.25 MG tablet Commonly known as: XANAX Take 0.25 mg by mouth at bedtime as needed for anxiety.   BIOTIN PO Take by mouth as needed.   docusate sodium 100 MG capsule Commonly known as: COLACE Take 1 capsule (100 mg total) by mouth 2 (two) times daily. To keep stools soft   Dulaglutide 1.5 MG/0.5ML Sopn Inject 3 mg into the skin once a week. Friday   estradiol 0.075 mg/24hr patch Commonly known as: CLIMARA - Dosed in mg/24 hr Place 0.075  mg onto the skin 2 (two) times a week.   fluticasone 50 MCG/ACT nasal spray Commonly known as: FLONASE Place 2 sprays into both nostrils as needed.   gabapentin 800 MG tablet Commonly known as: Neurontin Take 1 tablet (800 mg total) by mouth at bedtime for 14 days. Take nightly for 3 days, then up to 14 days as needed   ibuprofen 200 MG tablet Commonly known as: ADVIL Take 200 mg by mouth every 6 (six) hours as needed. 2 - 4 tablets   meloxicam 15 MG tablet Commonly known as: MOBIC Take 15 mg by mouth daily.   metFORMIN 500 MG 24 hr tablet Commonly known as: GLUCOPHAGE-XR Take 1,000 mg by mouth daily with breakfast.   mirabegron ER 25 MG Tb24 tablet Commonly known as: MYRBETRIQ Take 25 mg by mouth daily.   Olopatadine HCl 0.2 %  Soln Apply to eye as needed.   omeprazole 40 MG capsule Commonly known as: PRILOSEC Take 40 mg by mouth as needed.   ondansetron 4 MG disintegrating tablet Commonly known as: ZOFRAN-ODT Take 1 tablet (4 mg total) by mouth every 8 (eight) hours as needed for nausea or vomiting.   oxyCODONE 5 MG immediate release tablet Commonly known as: Oxy IR/ROXICODONE Take 1 tablet (5 mg total) by mouth every 4 (four) hours as needed for severe pain.   progesterone 100 MG capsule Commonly known as: PROMETRIUM Take 100 mg by mouth daily.   rosuvastatin 20 MG tablet Commonly known as: CRESTOR Take 20 mg by mouth daily.   valACYclovir 500 MG tablet Commonly known as: VALTREX Take 500 mg by mouth daily.   zolpidem 5 MG tablet Commonly known as: AMBIEN Take 5 mg by mouth at bedtime.       ASK your doctor about these medications    Acetaminophen Extra Strength 500 MG Tabs Take 2 tablets (1,000 mg total) by mouth every 6 (six) hours for 3 days. Ask about: Should I take this medication?   bethanechol 5 MG tablet Commonly known as: URECHOLINE Take 1 tablet (5 mg total) by mouth 3 (three) times daily for 3 days. Ask about: Should I take this medication?   ibuprofen 800 MG tablet Commonly known as: ADVIL Take 1 tablet (800 mg total) by mouth every 8 (eight) hours for 3 days. After 72hrs, may take PRN as needed Ask about: Should I take this medication?        Follow-up Information     Benjaman Kindler, MD Follow up in 2 week(s).   Specialty: Obstetrics and Gynecology Why: For postop check    May 31, 2022 @ 11:45 AM Contact information: [place] [address] [phone]         Yazeed Pryer, Gwen Her, MD Follow up in 2 week(s).   Specialty: Obstetrics and Gynecology Why: post op Contact information: [address] [place] [address] [phone]                 Signed: Gwen Her  Juvenal Umar 05/17/2022, 6:31 PM

## 2022-06-08 IMAGING — MG MM DIGITAL SCREENING BILAT W/ TOMO AND CAD
8 series · 8 of 24 positions shown · non-contrast
Comparison: Previous exam(s).

CLINICAL DATA: Screening.

EXAM:
DIGITAL SCREENING BILATERAL MAMMOGRAM WITH TOMO AND CAD

[L MLO synth-2D]
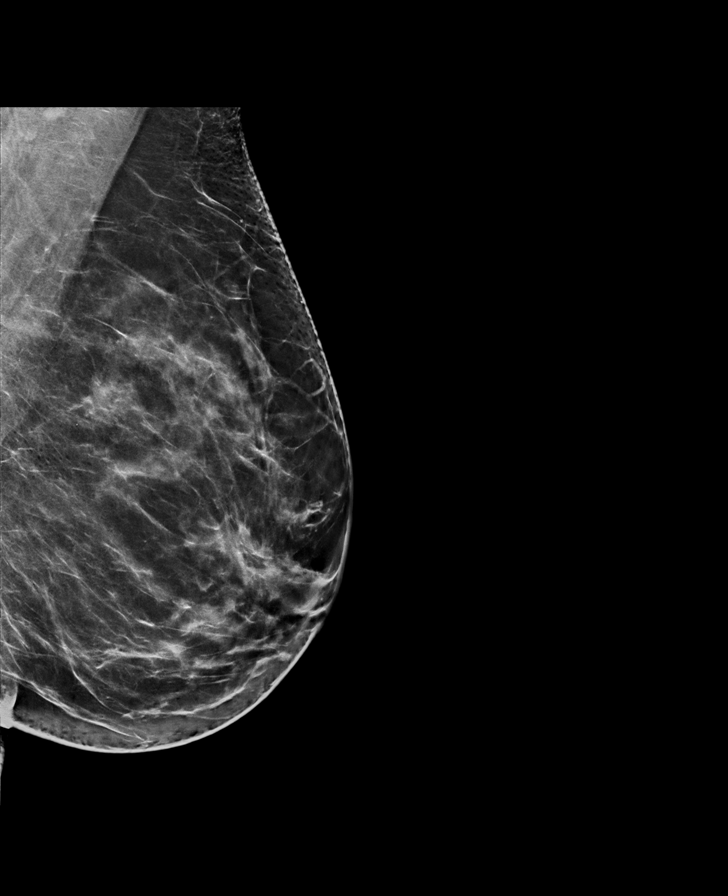

[R CC synth-2D]
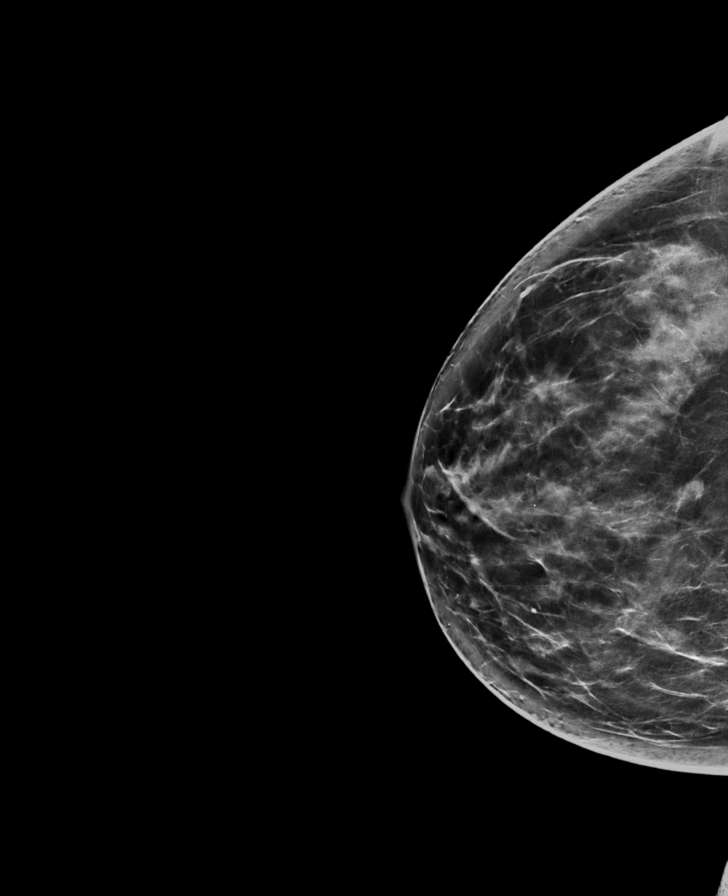

[L CC synth-2D]
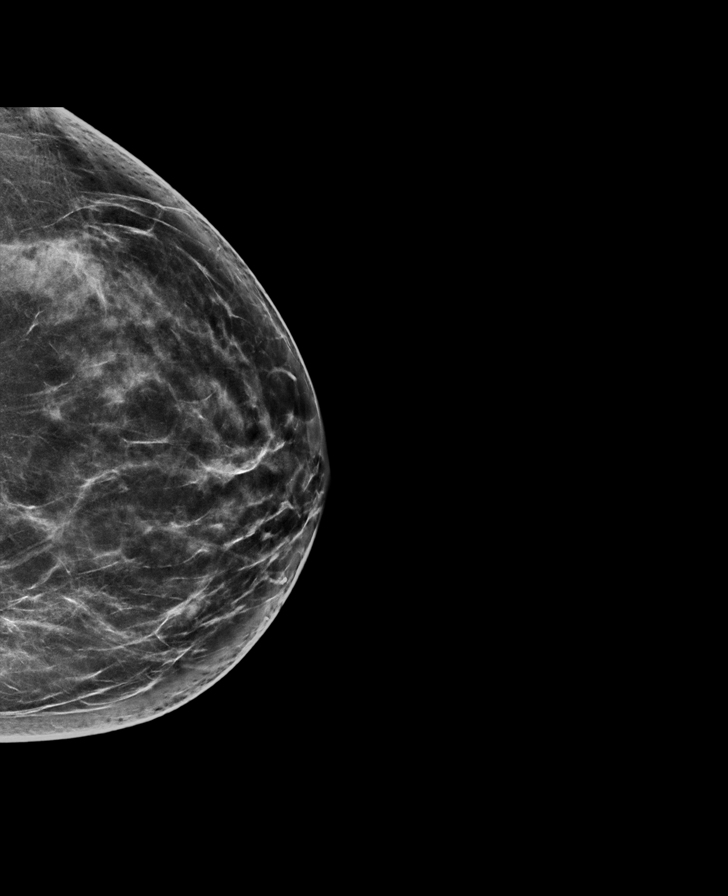

[R MLO synth-2D]
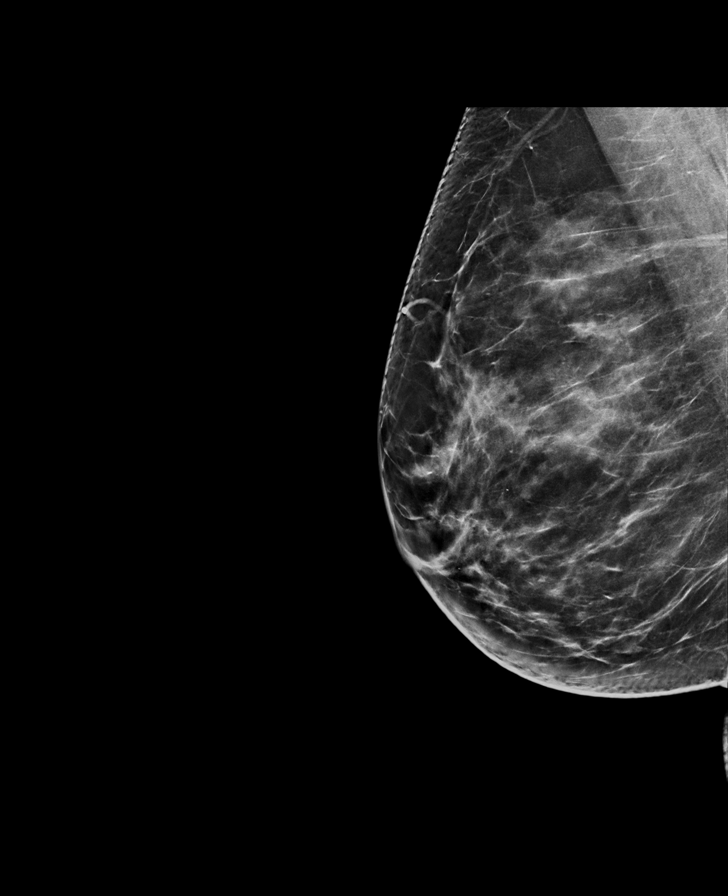

[R MLO tomo · tomo slice 43/84.0]
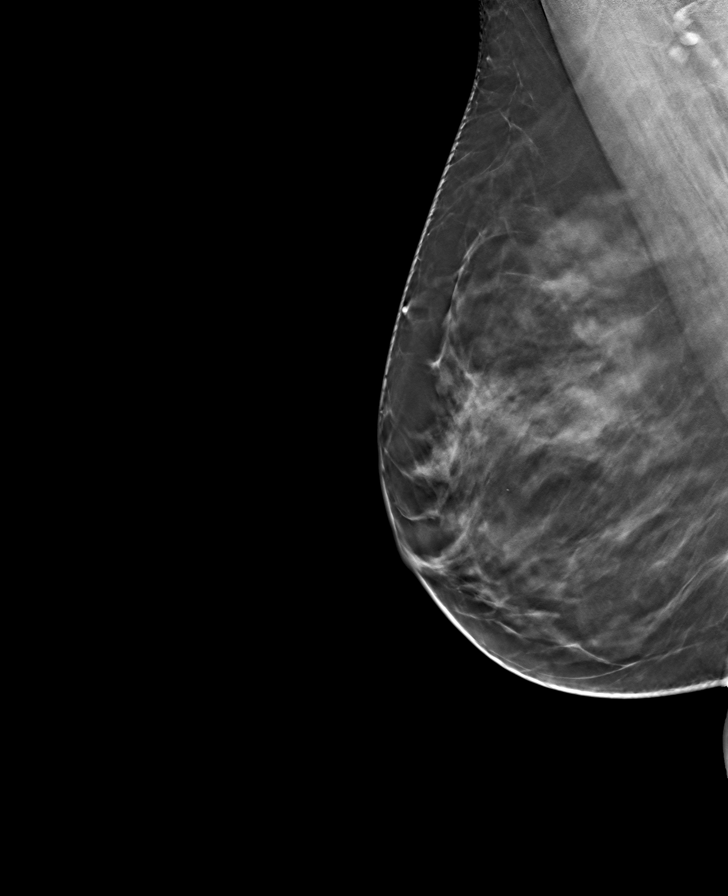

[L CC tomo · tomo slice 42/83.0]
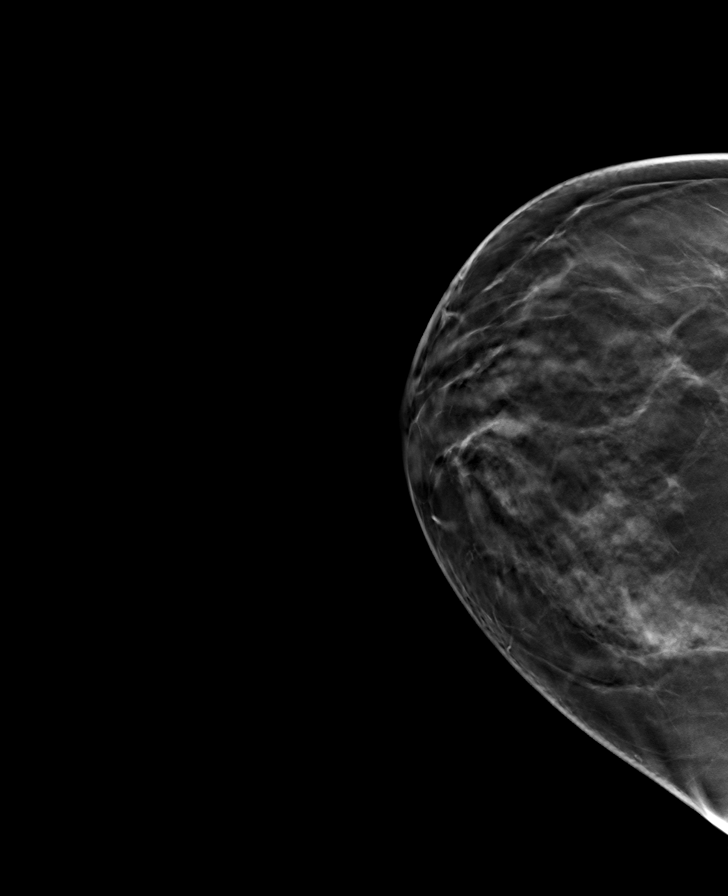

[L MLO tomo · tomo slice 44/87.0]
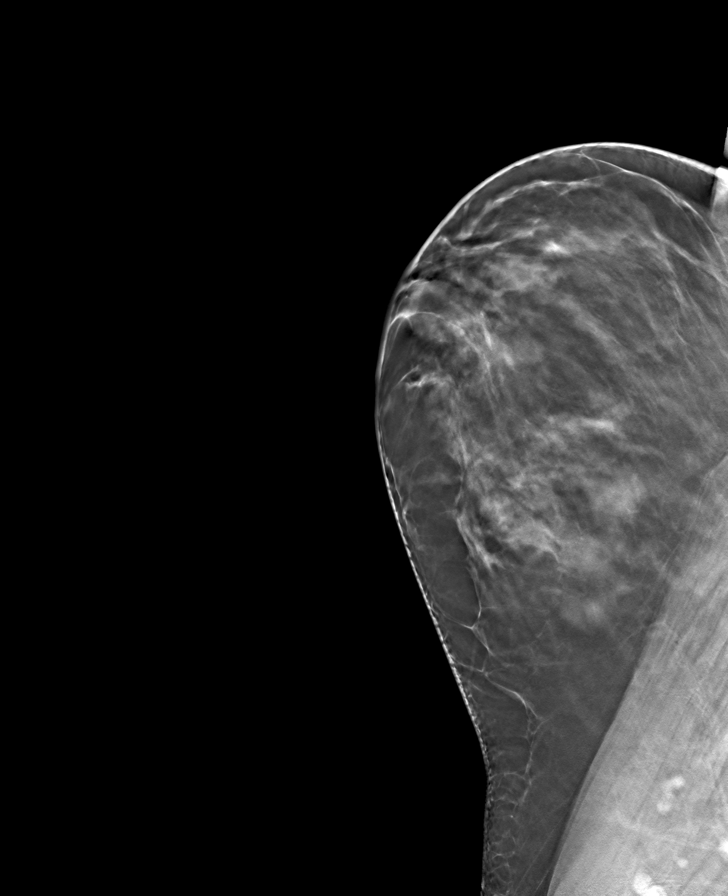

[R CC tomo · tomo slice 43/84.0]
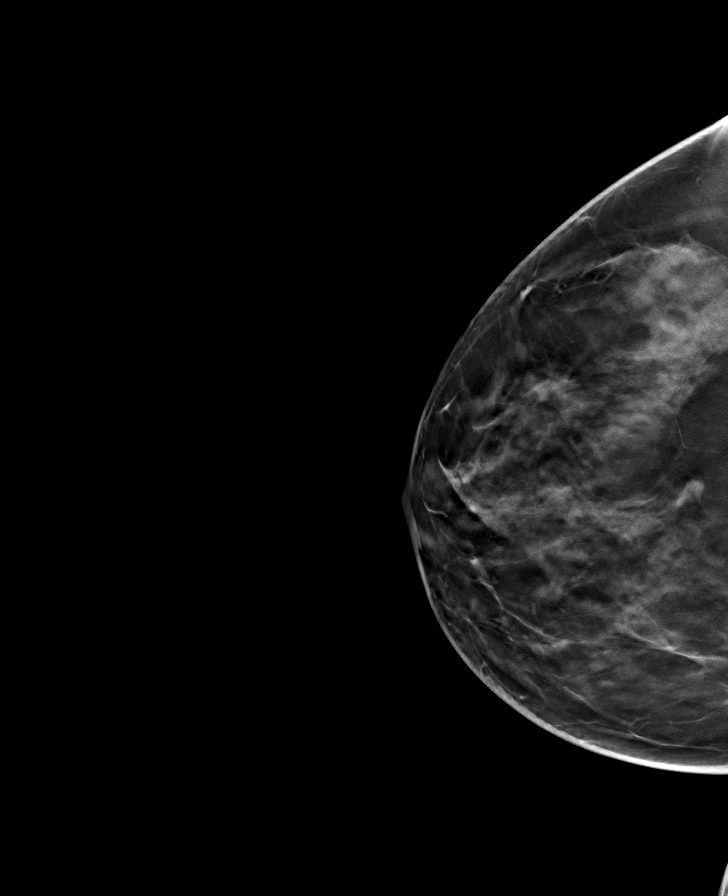

[8 of 24 positions shown; findings below may reference images not displayed]

ACR Breast Density Category c: The breast tissue is heterogeneously
dense, which may obscure small masses.
FINDINGS: There are no findings suspicious for malignancy. The images were
evaluated with computer-aided detection.
IMPRESSION: No mammographic evidence of malignancy. A result letter of this
screening mammogram will be mailed directly to the patient.

RECOMMENDATION:
Screening mammogram in one year. (Code:[id number])

BI-RADS CATEGORY  1: Negative.

## 2022-06-16 ENCOUNTER — Encounter: Payer: Self-pay | Admitting: Obstetrics and Gynecology

## 2022-11-01 ENCOUNTER — Other Ambulatory Visit: Payer: Self-pay | Admitting: Obstetrics and Gynecology

## 2022-11-01 DIAGNOSIS — Z1231 Encounter for screening mammogram for malignant neoplasm of breast: Secondary | ICD-10-CM

## 2022-11-22 ENCOUNTER — Ambulatory Visit
Admission: RE | Admit: 2022-11-22 | Discharge: 2022-11-22 | Disposition: A | Discharge status: Home / Self Care | Payer: Managed Care, Other (non HMO) | Source: Ambulatory Visit | Attending: Obstetrics and Gynecology | Admitting: Obstetrics and Gynecology

## 2022-11-22 DIAGNOSIS — Z1231 Encounter for screening mammogram for malignant neoplasm of breast: Secondary | ICD-10-CM | POA: Insufficient documentation

## 2023-10-31 ENCOUNTER — Other Ambulatory Visit: Payer: Self-pay | Admitting: Obstetrics and Gynecology

## 2023-10-31 DIAGNOSIS — Z1231 Encounter for screening mammogram for malignant neoplasm of breast: Secondary | ICD-10-CM

## 2023-11-17 ENCOUNTER — Ambulatory Visit (INDEPENDENT_AMBULATORY_CARE_PROVIDER_SITE_OTHER): Payer: Self-pay

## 2023-11-17 DIAGNOSIS — K635 Polyp of colon: Secondary | ICD-10-CM | POA: Diagnosis not present

## 2023-11-17 DIAGNOSIS — K5730 Diverticulosis of large intestine without perforation or abscess without bleeding: Secondary | ICD-10-CM | POA: Diagnosis not present

## 2023-11-17 DIAGNOSIS — Z860101 Personal history of adenomatous and serrated colon polyps: Secondary | ICD-10-CM | POA: Diagnosis not present

## 2023-11-17 DIAGNOSIS — Z09 Encounter for follow-up examination after completed treatment for conditions other than malignant neoplasm: Secondary | ICD-10-CM | POA: Diagnosis present

## 2023-11-23 ENCOUNTER — Ambulatory Visit
Admission: RE | Admit: 2023-11-23 | Discharge: 2023-11-23 | Disposition: A | Discharge status: Home / Self Care | Source: Ambulatory Visit | Attending: Obstetrics and Gynecology | Admitting: Obstetrics and Gynecology

## 2023-11-23 DIAGNOSIS — Z1231 Encounter for screening mammogram for malignant neoplasm of breast: Secondary | ICD-10-CM | POA: Insufficient documentation
